# Patient Record
Sex: Female | Born: 1995 | Race: Black or African American | Hispanic: No | Marital: Single | Smoking: Never smoker
Health system: Southern US, Community
[De-identification: ages and names within clinical notes are randomized; demographics above are authoritative.]

## PROBLEM LIST (undated history)

## (undated) HISTORY — PX: TONSILLECTOMY AND ADENOIDECTOMY: SHX28

---

## 2018-07-15 ENCOUNTER — Other Ambulatory Visit: Payer: Self-pay

## 2018-07-15 ENCOUNTER — Emergency Department (HOSPITAL_COMMUNITY)
Admission: EM | Admit: 2018-07-15 | Discharge: 2018-07-15 | Disposition: A | Discharge status: Home / Self Care | Payer: BLUE CROSS/BLUE SHIELD | Attending: Emergency Medicine | Admitting: Emergency Medicine

## 2018-07-15 ENCOUNTER — Encounter (HOSPITAL_COMMUNITY): Payer: Self-pay | Admitting: Emergency Medicine

## 2018-07-15 DIAGNOSIS — J069 Acute upper respiratory infection, unspecified: Secondary | ICD-10-CM | POA: Diagnosis not present

## 2018-07-15 DIAGNOSIS — B9789 Other viral agents as the cause of diseases classified elsewhere: Secondary | ICD-10-CM | POA: Diagnosis not present

## 2018-07-15 DIAGNOSIS — R05 Cough: Secondary | ICD-10-CM | POA: Diagnosis present

## 2018-07-15 NOTE — ED Provider Notes (Signed)
[facility]-EMERGENCY DEPT Provider Note   CSN: [id number] Arrival date & time: 07/15/18  2041     History   Chief Complaint Chief Complaint  Patient presents with  . Cough    HPI Kayla Duarte is a 22 y.o. female.  Patient to ED with symptoms of nasal congestion, sometimes productive cough, fever and generalized muscle aches x 4 days. She reports coworkers with similar symptoms. No nausea or vomiting. She has been taking mucinex at home which has made her cough productive but she does not feel any better. No rash, urinary symptoms, or diarrhea.  The history is provided by the patient. No language interpreter was used.  Cough  Associated symptoms include chills and myalgias.    History reviewed. No pertinent past medical history.  There are no active problems to display for this patient.   History reviewed. No pertinent surgical history.   OB History   No obstetric history on file.      Home Medications    Prior to Admission medications   Not on File    Family History No family history on file.  Social History Social History   Tobacco Use  . Smoking status: Not on file  Substance Use Topics  . Alcohol use: Not on file  . Drug use: Not on file     Allergies   Patient has no known allergies.   Review of Systems Review of Systems  Constitutional: Positive for chills and fever.  HENT: Positive for congestion.   Respiratory: Positive for cough.   Cardiovascular: Negative.   Gastrointestinal: Negative.  Negative for diarrhea and vomiting.  Musculoskeletal: Positive for myalgias. Negative for neck stiffness.  Skin: Negative.  Negative for rash.  Neurological: Negative.      Physical Exam Updated Vital Signs BP (!) 106/59   Pulse 80   Temp 99.9 F (37.7 C) (Oral)   Resp 14   Ht 5\' 8"  (1.727 m)   Wt 72.6 kg   LMP 07/15/2018   SpO2 100%   BMI 24.33 kg/m   Physical Exam Constitutional:      Appearance: She is  well-developed.  HENT:     Head: Normocephalic.  Neck:     Musculoskeletal: Normal range of motion and neck supple.  Cardiovascular:     Rate and Rhythm: Normal rate and regular rhythm.     Heart sounds: No murmur.  Pulmonary:     Effort: Pulmonary effort is normal.     Breath sounds: Normal breath sounds. No wheezing, rhonchi or rales.  Abdominal:     General: Bowel sounds are normal.     Palpations: Abdomen is soft.     Tenderness: There is no abdominal tenderness. There is no guarding or rebound.  Musculoskeletal: Normal range of motion.  Lymphadenopathy:     Cervical: No cervical adenopathy.  Skin:    General: Skin is warm and dry.     Findings: No rash.  Neurological:     Mental Status: She is alert and oriented to person, place, and time.      ED Treatments / Results  Labs (all labs ordered are listed, but only abnormal results are displayed) Labs Reviewed - No data to display  EKG None  Radiology No results found.  Procedures Procedures (including critical care time)  Medications Ordered in ED Medications - No data to display   Initial Impression / Assessment and Plan / ED Course  I have reviewed the triage vital signs and the  nursing notes.  Pertinent labs & imaging results that were available during my care of the patient were reviewed by me and considered in my medical decision making (see chart for details).     Patient to ED with URI symptoms including fever, cough, congestion and body aches.   She is very well appearing. Exam is benign. Suspect viral illness requiring supportive care.   Final Clinical Impressions(s) / ED Diagnoses   Final diagnoses:  None   1. URI   ED Discharge Orders    None       Elpidio Anis, PA-C 07/16/18 0040    Dione Booze, MD 07/16/18 [phone]

## 2018-07-15 NOTE — ED Triage Notes (Signed)
Patient c/o cough, congestion, body aches and fever x4 days.

## 2018-07-15 NOTE — Discharge Instructions (Addendum)
Recommend Advil Cold and Sinus; plain saline nasal spray; Mucinex; fluids and plenty of rest. Return here or be seen by your doctor if symptoms persist or worsen.

## 2019-07-31 DIAGNOSIS — G8929 Other chronic pain: Secondary | ICD-10-CM

## 2019-07-31 HISTORY — DX: Other chronic pain: G89.29

## 2019-08-14 ENCOUNTER — Encounter (HOSPITAL_COMMUNITY): Payer: Self-pay | Admitting: Emergency Medicine

## 2019-08-14 ENCOUNTER — Other Ambulatory Visit: Payer: Self-pay

## 2019-08-14 ENCOUNTER — Emergency Department (HOSPITAL_COMMUNITY)
Admission: EM | Admit: 2019-08-14 | Discharge: 2019-08-14 | Disposition: A | Discharge status: Home / Self Care | Payer: BC Managed Care – PPO | Attending: Emergency Medicine | Admitting: Emergency Medicine

## 2019-08-14 DIAGNOSIS — Z20822 Contact with and (suspected) exposure to covid-19: Secondary | ICD-10-CM | POA: Diagnosis not present

## 2019-08-14 DIAGNOSIS — Y93I9 Activity, other involving external motion: Secondary | ICD-10-CM | POA: Insufficient documentation

## 2019-08-14 DIAGNOSIS — M545 Low back pain: Secondary | ICD-10-CM | POA: Diagnosis not present

## 2019-08-14 DIAGNOSIS — Y999 Unspecified external cause status: Secondary | ICD-10-CM | POA: Diagnosis not present

## 2019-08-14 DIAGNOSIS — M542 Cervicalgia: Secondary | ICD-10-CM | POA: Insufficient documentation

## 2019-08-14 DIAGNOSIS — Y92411 Interstate highway as the place of occurrence of the external cause: Secondary | ICD-10-CM | POA: Insufficient documentation

## 2019-08-14 LAB — SARS CORONAVIRUS 2 (TAT 6-24 HRS): SARS Coronavirus 2: NEGATIVE

## 2019-08-14 MED ORDER — CYCLOBENZAPRINE HCL 10 MG PO TABS: 10.0000 mg | ORAL_TABLET | Freq: Two times a day (BID) | ORAL | 0 refills | Status: DC | PRN

## 2019-08-14 MED ORDER — IBUPROFEN 600 MG PO TABS: 600.0000 mg | ORAL_TABLET | Freq: Four times a day (QID) | ORAL | 0 refills | Status: DC | PRN
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
## 2019-08-18 ENCOUNTER — Encounter: Payer: Self-pay | Admitting: Orthopaedic Surgery

## 2019-08-18 ENCOUNTER — Ambulatory Visit (INDEPENDENT_AMBULATORY_CARE_PROVIDER_SITE_OTHER): Payer: BC Managed Care – PPO | Admitting: Orthopaedic Surgery

## 2019-08-18 ENCOUNTER — Ambulatory Visit: Payer: Self-pay

## 2019-08-18 ENCOUNTER — Other Ambulatory Visit: Payer: Self-pay

## 2019-08-18 DIAGNOSIS — S161XXA Strain of muscle, fascia and tendon at neck level, initial encounter: Secondary | ICD-10-CM

## 2019-08-18 MED ORDER — PREDNISONE 10 MG (21) PO TBPK: ORAL_TABLET | ORAL | 0 refills | Status: DC

## 2019-08-18 MED ORDER — METHOCARBAMOL 500 MG PO TABS: 500.0000 mg | ORAL_TABLET | Freq: Two times a day (BID) | ORAL | 0 refills | Status: DC | PRN
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
## 2019-08-26 ENCOUNTER — Ambulatory Visit (INDEPENDENT_AMBULATORY_CARE_PROVIDER_SITE_OTHER): Payer: BC Managed Care – PPO | Admitting: Orthopaedic Surgery

## 2019-08-26 ENCOUNTER — Other Ambulatory Visit: Payer: Self-pay

## 2019-08-26 ENCOUNTER — Encounter: Payer: Self-pay | Admitting: Orthopaedic Surgery

## 2019-08-26 DIAGNOSIS — S161XXD Strain of muscle, fascia and tendon at neck level, subsequent encounter: Secondary | ICD-10-CM
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
## 2020-05-30 DIAGNOSIS — H469 Unspecified optic neuritis: Secondary | ICD-10-CM

## 2020-05-30 HISTORY — DX: Unspecified optic neuritis: H46.9

## 2020-06-16 ENCOUNTER — Emergency Department (HOSPITAL_COMMUNITY)
Admission: EM | Admit: 2020-06-16 | Discharge: 2020-06-16 | Discharge status: Against Medical Advice | Payer: BC Managed Care – PPO | Source: Home / Self Care | Attending: Emergency Medicine | Admitting: Emergency Medicine

## 2020-06-16 ENCOUNTER — Encounter (HOSPITAL_COMMUNITY): Payer: Self-pay

## 2020-06-16 ENCOUNTER — Other Ambulatory Visit: Payer: Self-pay

## 2020-06-16 ENCOUNTER — Emergency Department (HOSPITAL_COMMUNITY): Payer: BC Managed Care – PPO

## 2020-06-16 DIAGNOSIS — H5711 Ocular pain, right eye: Secondary | ICD-10-CM | POA: Insufficient documentation

## 2020-06-16 DIAGNOSIS — H539 Unspecified visual disturbance: Secondary | ICD-10-CM

## 2020-06-16 DIAGNOSIS — H469 Unspecified optic neuritis: Secondary | ICD-10-CM | POA: Diagnosis not present

## 2020-06-16 DIAGNOSIS — H538 Other visual disturbances: Secondary | ICD-10-CM | POA: Insufficient documentation

## 2020-06-16 NOTE — Progress Notes (Signed)
Spoke to charge RN MRI scanner is down will call back once its back up.

## 2020-06-16 NOTE — ED Notes (Signed)
Pt and pt mother denied hospital transport to [facility] for MRI. Pt and pt mother, when leaving the ED AMA stated they were going to [facility] so they could get their MRI tonight instead of coming back tomorrow out patient. Army Melia, PA aware.

## 2020-06-16 NOTE — Discharge Instructions (Addendum)
As discussed emergent MRI of the brain and orbits ordered by your eye doctor, unable to complete at [facility] at this time. Recommend [facility] for MRI tonight but you have declined transfer. Order for MRI was sent to [facility]. Please return to the ER for your imaging. Otherwise, order sent for imaging to be done tomorrow.

## 2020-06-16 NOTE — ED Provider Notes (Signed)
[facility]-EMERGENC[facility] DEPT Provider Note   CSN: [id number] Arrival date & time: 06/16/20  1521     History Chief Complaint  Patient presents with  . Blurred Vision    Kayla Duarte is a 24 y.o. female.  24 year old female sent to the ER by Dr. Sherryll Burger with ophthalmology for MRI brain with and without contrast as well as MRI orbit with and without contrast for concern for optic neuritis, possible MS. Patient wears glasses, first noticed pain in her right eye described as eye strain 2 days ago as well as central area of blurry vision with normal peripheral vision. Also reports tenderness with palpation around her right eye. Patient removed her contacts and has been wearing her glasses. Denies trauma to the eye, photophobia, family history of MS or other concerns. Patient was given phenelepherine drops in the office and reports her vision and pain have since improved.         History reviewed. No pertinent past medical history.  There are no problems to display for this patient.   History reviewed. No pertinent surgical history.   OB History   No obstetric history on file.     History reviewed. No pertinent family history.  Social History   Tobacco Use  . Smoking status: Never Smoker  . Smokeless tobacco: Never Used  Substance Use Topics  . Alcohol use: Not on file  . Drug use: Not on file    Home Medications Prior to Admission medications   Medication Sig Start Date End Date Taking? Authorizing Provider  cyclobenzaprine (FLEXERIL) 10 MG tablet Take 1 tablet (10 mg total) by mouth 2 (two) times daily as needed for muscle spasms. Patient not taking: Reported on 08/26/2019 08/14/19   Fayrene Helper, PA-C  ibuprofen (ADVIL) 600 MG tablet Take 1 tablet (600 mg total) by mouth every 6 (six) hours as needed. 08/14/19   Fayrene Helper, PA-C  methocarbamol (ROBAXIN) 500 MG tablet Take 1 tablet (500 mg total) by mouth 2 (two) times daily as needed. Patient not  taking: Reported on 08/26/2019 08/18/19   Cristie Hem, PA-C  predniSONE (STERAPRED UNI-PAK 21 TAB) 10 MG (21) TBPK tablet Take as directed Patient not taking: Reported on 08/26/2019 08/18/19   Cristie Hem, PA-C    Allergies    Patient has no known allergies.  Review of Systems   Review of Systems  Constitutional: Negative for chills and fever.  HENT: Negative for congestion and sinus pain.   Eyes: Positive for pain and visual disturbance. Negative for photophobia and redness.  Musculoskeletal: Negative for neck pain and neck stiffness.  Skin: Negative for color change, rash and wound.  Allergic/Immunologic: Negative for immunocompromised state.  Neurological: Positive for headaches. Negative for speech difficulty, weakness and numbness.  Hematological: Negative for adenopathy.  All other systems reviewed and are negative.   Physical Exam Updated Vital Signs BP 130/69   Pulse 79   Temp 98.1 F (36.7 C) (Oral)   Resp 18   SpO2 99%   Physical Exam Vitals and nursing note reviewed.  Constitutional:      General: She is not in acute distress.    Appearance: She is well-developed. She is not diaphoretic.  HENT:     Head: Normocephalic and atraumatic.     Nose: Nose normal.     Mouth/Throat:     Mouth: Mucous membranes are moist.  Eyes:     General: No visual field deficit or scleral icterus.  Right eye: No discharge.        Left eye: No discharge.     Extraocular Movements: Extraocular movements intact.     Conjunctiva/sclera: Conjunctivae normal.     Comments: Pupils dilated at ophthalmology office, reactive   Pulmonary:     Effort: Pulmonary effort is normal.  Lymphadenopathy:     Cervical: No cervical adenopathy.  Skin:    General: Skin is warm.     Findings: No erythema or rash.  Neurological:     Mental Status: She is alert and oriented to person, place, and time.     Cranial Nerves: No cranial nerve deficit or facial asymmetry.     Sensory: No  sensory deficit.     Motor: No weakness.     Gait: Gait normal.  Psychiatric:        Behavior: Behavior normal.     ED Results / Procedures / Treatments   Labs (all labs ordered are listed, but only abnormal results are displayed) Labs Reviewed - No data to display  EKG None  Radiology No results found.  Procedures Procedures (including critical care time)  Medications Ordered in ED Medications - No data to display  ED Course  I have reviewed the triage vital signs and the nursing notes.  Pertinent labs & imaging results that were available during my care of the patient were reviewed by me and considered in my medical decision making (see chart for details).  Clinical Course as of Jun 17 1919  Thu Jun 16, 2020  [id number] 24 year old female sent by ophthalmology for MRI brain and orbit.  Review of records from ophthalmology, concern for trace disc edema and hyperemia at the right eye.  Patient reports symptoms have improved since leaving ophthalmology. MRI was ordered for possible optic neuritis with concern for associated MS. Unfortunately, MRI at this emergency room is not available at this time and is currently being worked with unknown return to service time. Offered to transfer patient to [facility] for MRI tonight, patient and her mother ultimately declined with plan to have her imaging done in the morning. Discussed reasons for emergent imaging and delaying until tomorrow could result in poor outcome. Order sent for OP imaging to [facility].  After leaving AMA, patient and mother commented to nurse on the way out that they would be going to [facility] after all for imaging tonight. Informed Dr. Stevie Kern and green zone team of patient's possible pending arrival to [facility] tonight.    [LM]    Clinical Course User Index [LM] Alden Hipp   MDM Rules/Calculators/A&P                          Final Clinical Impression(s) / ED Diagnoses Final diagnoses:  Visual disturbance  Pain of  right eye    Rx / DC Orders ED Discharge Orders         Ordered    MR BRAIN W WO CONTRAST        06/16/20 1849    MR ORBITS W WO CONTRAST        06/16/20 1849           Jeannie Fend, PA-C 06/16/20 1920    Pollyann Savoy, MD 06/16/20 2023

## 2020-06-16 NOTE — ED Triage Notes (Signed)
Pt coming from eye doctor appointment for blurry vision. Per pt- the eye doctor stated that she has optic neuritis and needs to come to ED for further evaluation and possibly a MRI. Pt brought paperwork from eye appointment.

## 2020-06-17 ENCOUNTER — Encounter (HOSPITAL_COMMUNITY): Payer: Self-pay

## 2020-06-17 ENCOUNTER — Emergency Department (HOSPITAL_COMMUNITY): Payer: BC Managed Care – PPO

## 2020-06-17 ENCOUNTER — Other Ambulatory Visit: Payer: Self-pay

## 2020-06-17 ENCOUNTER — Inpatient Hospital Stay (HOSPITAL_COMMUNITY)
Admission: EM | Admit: 2020-06-17 | Discharge: 2020-06-21 | DRG: 123 | Disposition: A | Discharge status: Home / Self Care | Payer: BC Managed Care – PPO | Source: Ambulatory Visit | Attending: Student | Admitting: Student

## 2020-06-17 DIAGNOSIS — H469 Unspecified optic neuritis: Principal | ICD-10-CM

## 2020-06-17 DIAGNOSIS — Z6832 Body mass index (BMI) 32.0-32.9, adult: Secondary | ICD-10-CM | POA: Diagnosis not present

## 2020-06-17 DIAGNOSIS — Z833 Family history of diabetes mellitus: Secondary | ICD-10-CM

## 2020-06-17 DIAGNOSIS — Z20822 Contact with and (suspected) exposure to covid-19: Secondary | ICD-10-CM | POA: Diagnosis present

## 2020-06-17 DIAGNOSIS — E669 Obesity, unspecified: Secondary | ICD-10-CM | POA: Diagnosis present

## 2020-06-17 DIAGNOSIS — Z8249 Family history of ischemic heart disease and other diseases of the circulatory system: Secondary | ICD-10-CM | POA: Diagnosis not present

## 2020-06-17 LAB — BASIC METABOLIC PANEL
Anion gap: 7 (ref 5–15)
BUN: 7 mg/dL (ref 6–20)
CO2: 25 mmol/L (ref 22–32)
Calcium: 9.1 mg/dL (ref 8.9–10.3)
Chloride: 106 mmol/L (ref 98–111)
Creatinine, Ser: 0.59 mg/dL (ref 0.44–1.00)
GFR, Estimated: >60 mL/min (ref >60)
Glucose, Bld: 87 mg/dL (ref 70–99)
Potassium: 4.4 mmol/L (ref 3.5–5.1)
Sodium: 138 mmol/L (ref 135–145)

## 2020-06-17 LAB — CBC WITH DIFFERENTIAL/PLATELET
Abs Immature Granulocytes: 0 10*3/uL (ref 0.00–0.07)
Basophils Absolute: 0.1 10*3/uL (ref 0.0–0.1)
Basophils Relative: 1 %
Eosinophils Absolute: 0.1 10*3/uL (ref 0.0–0.5)
Eosinophils Relative: 2 %
HCT: 42.7 % (ref 36.0–46.0)
Hemoglobin: 13.9 g/dL (ref 12.0–15.0)
Immature Granulocytes: 0 %
Lymphocytes Relative: 39 %
Lymphs Abs: 1.7 10*3/uL (ref 0.7–4.0)
MCH: 29.3 pg (ref 26.0–34.0)
MCHC: 32.6 g/dL (ref 30.0–36.0)
MCV: 89.9 fL (ref 80.0–100.0)
Monocytes Absolute: 0.3 10*3/uL (ref 0.1–1.0)
Monocytes Relative: 7 %
Neutro Abs: 2.2 10*3/uL (ref 1.7–7.7)
Neutrophils Relative %: 51 %
Platelets: 355 10*3/uL (ref 150–400)
RBC: 4.75 MIL/uL (ref 3.87–5.11)
RDW: 13.9 % (ref 11.5–15.5)
WBC: 4.3 10*3/uL (ref 4.0–10.5)
nRBC: 0 % (ref 0.0–0.2)

## 2020-06-17 LAB — HIV ANTIBODY (ROUTINE TESTING W REFLEX): HIV Screen 4th Generation wRfx: NONREACTIVE

## 2020-06-17 LAB — I-STAT BETA HCG BLOOD, ED (MC, WL, AP ONLY): I-stat hCG, quantitative: <5 m[IU]/mL (ref <5)

## 2020-06-17 LAB — RESP PANEL BY RT-PCR (FLU A&B, COVID) ARPGX2
Influenza A by PCR: NEGATIVE
Influenza B by PCR: NEGATIVE
SARS Coronavirus 2 by RT PCR: NEGATIVE

## 2020-06-17 IMAGING — MR MR ORBITS WO/W CM
20 series · 48 of 48 positions shown · IV contrast (gadavist)
Comparison: No pertinent prior exams are available for comparison.

CLINICAL DATA: Multiple sclerosis, new event. Optic neuritis
suspected. Additional history provided: Diagnosed with optic
neuritis by eye doctor yesterday, concern for MS, patient reports
right eye pain.

EXAM:
MRI HEAD AND ORBITS WITHOUT AND WITH CONTRAST
TECHNIQUE: Multiplanar, multiecho pulse sequences of the brain and surrounding
structures were obtained without and with intravenous contrast.
Multiplanar, multiecho pulse sequences of the orbits and surrounding
structures were obtained including fat saturation techniques, before
and after intravenous contrast administration.
CONTRAST:  7mL GADAVIST GADOBUTROL 1 MMOL/ML IV SOLN

[Series 5: DWI · axial · 3.0mm · 1.31mm/px · z∈[-68,+85]mm · 6 of 108 slices shown (1 of 4)]
[im 1/108]
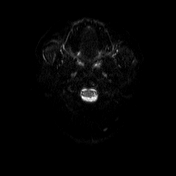
[im 22/108]
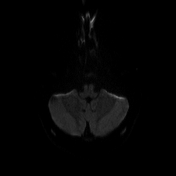
[im 43/108]
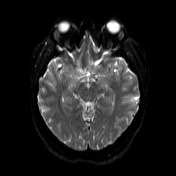
[im 65/108]
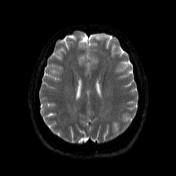
[im 86/108]
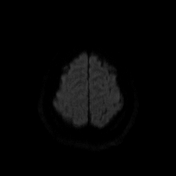
[im 108/108]
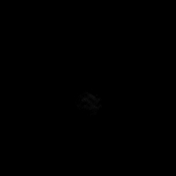

[Series 6: DWI · axial · 3.0mm · 1.31mm/px · z∈[-68,+85]mm · 3 of 54 slices shown (2 of 4)]
[im 1/54]
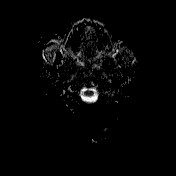
[im 27/54]
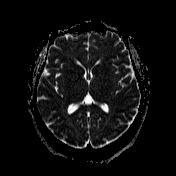
[im 54/54]
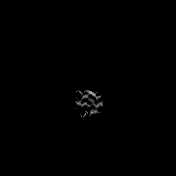

[Series 7: mip_images(sw) · axial · 24.0mm · 0.69mm/px · z∈[-63,+76]mm · 2 of 49 slices shown]
[im 1/49]
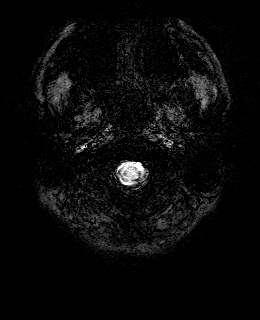
[im 49/49]
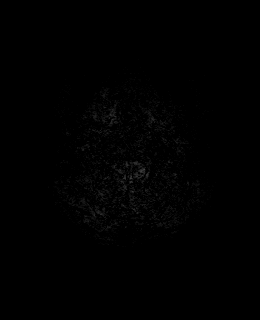

[Series 8: swi_images · axial · 3.0mm · 0.69mm/px · z∈[-73,+86]mm · 3 of 56 slices shown]
[im 1/56]
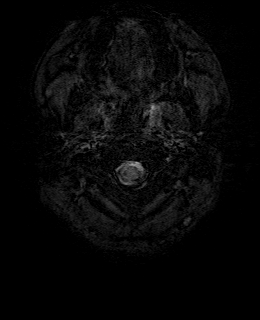
[im 28/56]
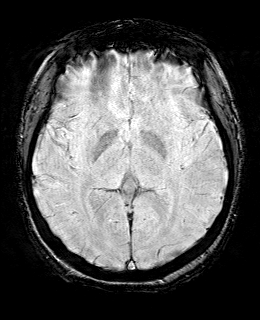
[im 56/56]
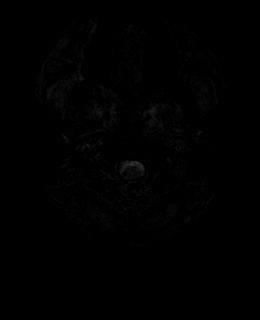

[Series 9: FLAIR · axial · 3.0mm · 0.69mm/px · z∈[-72,+81]mm · 2 of 54 slices shown]
[im 1/54]
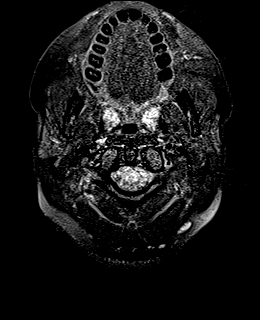
[im 54/54]
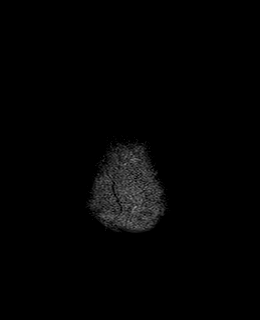

[Series 10: T1 · sagittal · 5.0mm · 0.75mm/px · 1 of 25 slices shown (1 of 4)]
[im 1/25]
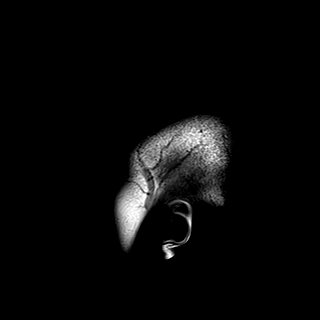

[Series 11: T2 · axial · 5.0mm · 0.57mm/px · 1 of 26 slices shown (1 of 2)]
[im 1/26]
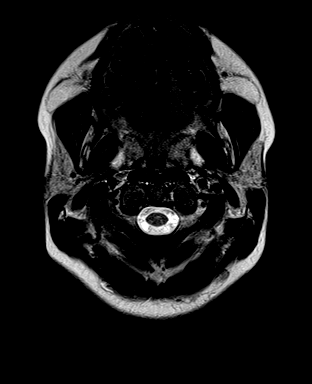

[Series 12: T2 · coronal · 5.0mm · 0.57mm/px · 1 of 27 slices shown (2 of 2)]
[im 1/27]
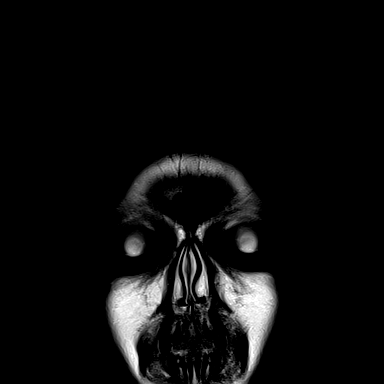

[Series 13: T1 · axial · 1.0mm · 0.86mm/px · z∈[-83,+86]mm · 8 of 176 slices shown (2 of 4)]
[im 1/176]
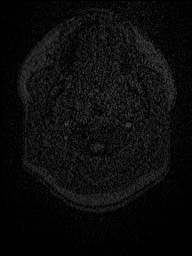
[im 26/176]
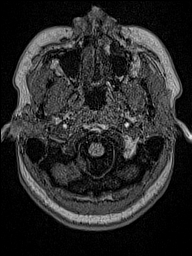
[im 51/176]
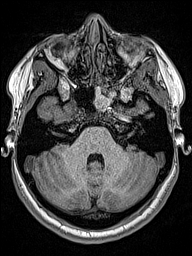
[im 76/176]
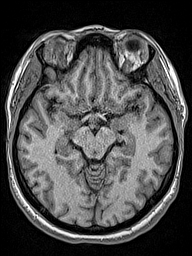
[im 101/176]
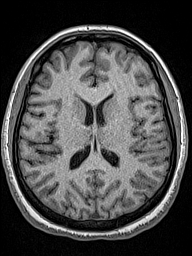
[im 126/176]
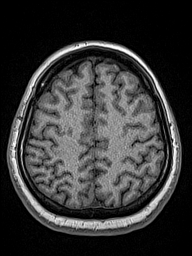
[im 151/176]
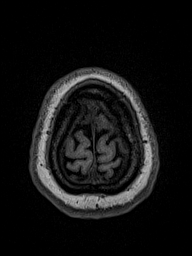
[im 176/176]
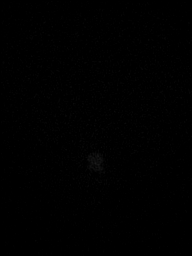

[Series 14: DWI · coronal · 5.0mm · 1.31mm/px · 3 of 72 slices shown (3 of 4)]
[im 1/72]
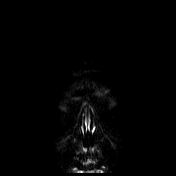
[im 36/72]
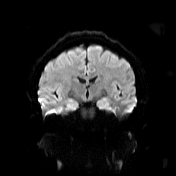
[im 72/72]
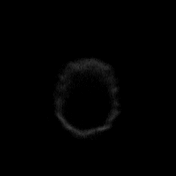

[Series 15: DWI · coronal · 5.0mm · 1.31mm/px · 2 of 36 slices shown (4 of 4)]
[im 1/36]
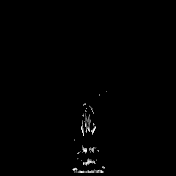
[im 36/36]
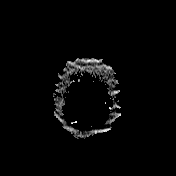

[Series 16: T2 fat-sat · axial · 3.0mm · 0.47mm/px · 1 of 17 slices shown (1 of 2)]
[im 1/17]
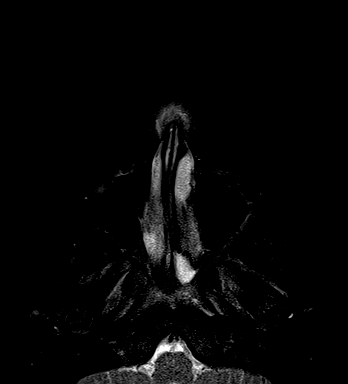

[Series 17: T1 · axial · 3.0mm · 0.56mm/px · 1 of 17 slices shown (3 of 4)]
[im 1/17]
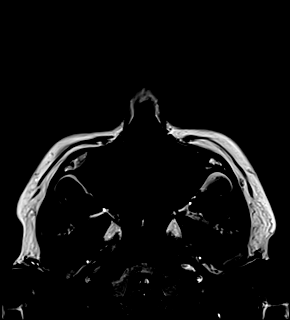

[Series 18: T2 fat-sat · coronal · 3.0mm · 0.47mm/px · 1 of 29 slices shown (2 of 2)]
[im 1/29]
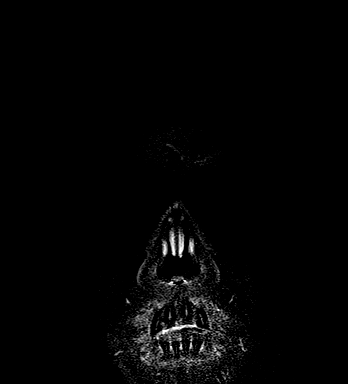

[Series 19: T1 · coronal · 3.0mm · 0.56mm/px · 1 of 29 slices shown (4 of 4)]
[im 1/29]
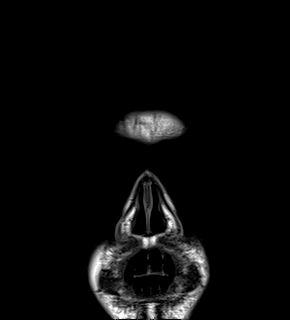

[Series 20: T1 fat-sat post-contrast · axial · 3.0mm · 0.56mm/px · 1 of 17 slices shown (1 of 2)]
[im 1/17]
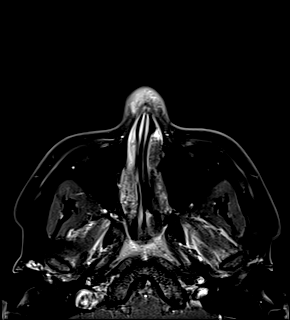

[Series 21: T1 fat-sat post-contrast · coronal · 3.0mm · 0.70mm/px · 1 of 29 slices shown (2 of 2)]
[im 1/29]
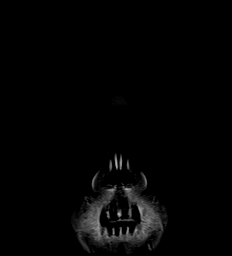

[Series 22: T1 post-contrast · axial · 1.0mm · 0.86mm/px · z∈[-83,+86]mm · 8 of 176 slices shown (1 of 3)]
[im 1/176]
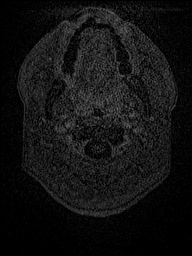
[im 26/176]
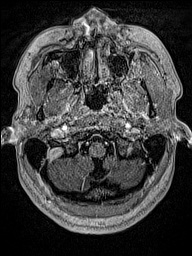
[im 51/176]
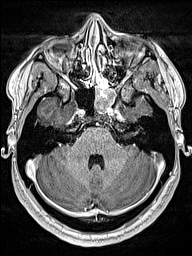
[im 76/176]
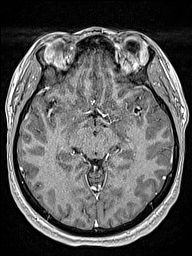
[im 101/176]
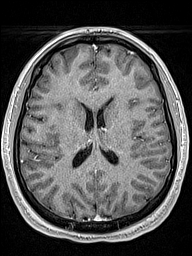
[im 126/176]
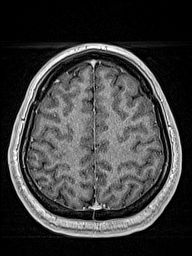
[im 151/176]
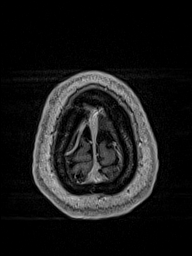
[im 176/176]
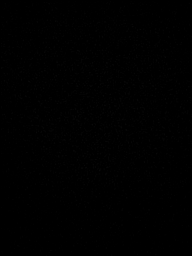

[Series 23: T1 post-contrast · coronal · 5.0mm · 0.43mm/px · 1 of 27 slices shown (2 of 3)]
[im 1/27]
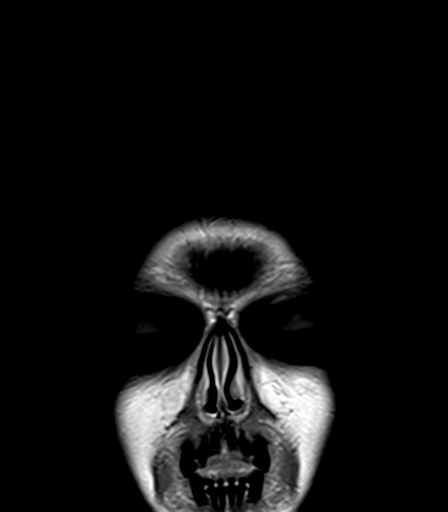

[Series 24: T1 post-contrast · sagittal · 5.0mm · 0.75mm/px · 1 of 25 slices shown (3 of 3)]
[im 1/25]
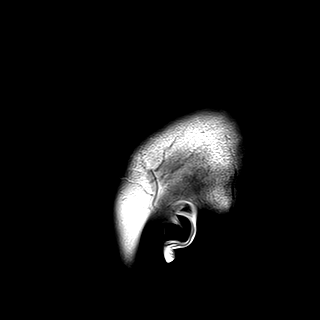

[48 of 48 positions shown; findings below may reference images not displayed]

FINDINGS: MRI HEAD FINDINGS

Brain:

Mild intermittent motion degradation.

Cerebral volume is normal.

No focal parenchymal signal abnormality or abnormal intracranial
enhancement is identified.

There is no acute infarct.

No evidence of intracranial mass.

No chronic intracranial blood products.

No extra-axial fluid collection.

No midline shift.

Vascular: Normal proximal arterial flow voids. Expected vascular
enhancement.

Skull and upper cervical spine: No focal marrow lesion.

MRI ORBITS FINDINGS

Orbits: There is swelling, T2 hyperintensity and abnormal
enhancement of the intraorbital right optic nerve. The globes are
normal in size and contour. The extraocular muscles are symmetric
and unremarkable. Normal appearance of the left optic nerve sheath
complex.

Visualized sinuses: Inspissated secretions throughout the left
sphenoid sinus. Mild bilateral ethmoid sinus mucosal thickening. 13
mm focus of polypoid soft tissue within the left nasal passage
(series 16, image 5).

Soft tissues: The visualized maxillofacial soft tissues are
unremarkable.
IMPRESSION: MRI brain:

Unremarkable MRI appearance of the brain. No evidence of acute
intracranial abnormality.

MRI orbits:

1. Swelling, T2 hyperintensity and abnormal enhancement of the
intraorbital right optic nerve compatible with acute right optic
neuritis.
2. Ethmoid and left sphenoid sinusitis.
3. Polypoid soft tissue measuring 13 mm within the left nasal
passage. Direct visualization is recommended.

## 2020-06-17 IMAGING — MR MR HEAD WO/W CM
20 series · 48 of 48 positions shown · IV contrast (gadavist)
Comparison: No pertinent prior exams are available for comparison.

CLINICAL DATA: Multiple sclerosis, new event. Optic neuritis
suspected. Additional history provided: Diagnosed with optic
neuritis by eye doctor yesterday, concern for MS, patient reports
right eye pain.

EXAM:
MRI HEAD AND ORBITS WITHOUT AND WITH CONTRAST
TECHNIQUE: Multiplanar, multiecho pulse sequences of the brain and surrounding
structures were obtained without and with intravenous contrast.
Multiplanar, multiecho pulse sequences of the orbits and surrounding
structures were obtained including fat saturation techniques, before
and after intravenous contrast administration.
CONTRAST:  7mL GADAVIST GADOBUTROL 1 MMOL/ML IV SOLN

[Series 5: DWI · axial · 3.0mm · 1.31mm/px · z∈[-68,+85]mm · 6 of 108 slices shown (1 of 4)]
[im 1/108]
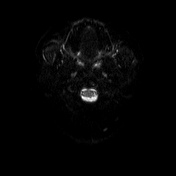
[im 22/108]
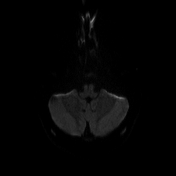
[im 43/108]
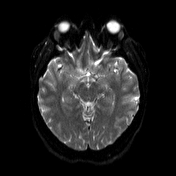
[im 65/108]
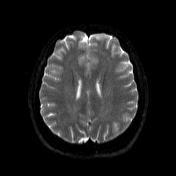
[im 86/108]
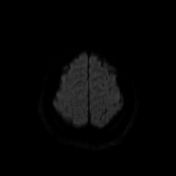
[im 108/108]
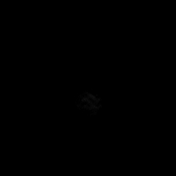

[Series 6: DWI · axial · 3.0mm · 1.31mm/px · z∈[-68,+85]mm · 3 of 54 slices shown (2 of 4)]
[im 1/54]
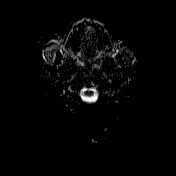
[im 27/54]
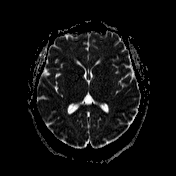
[im 54/54]
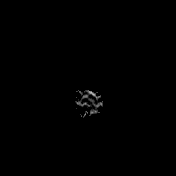

[Series 7: mip_images(sw) · axial · 24.0mm · 0.69mm/px · z∈[-63,+76]mm · 2 of 49 slices shown]
[im 1/49]
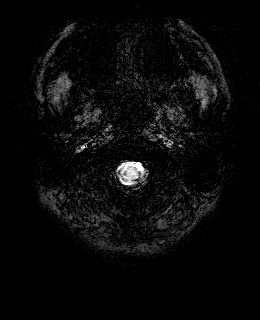
[im 49/49]
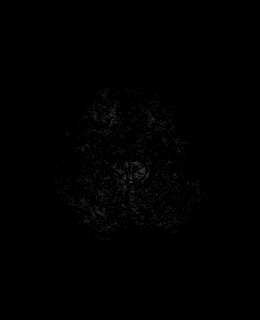

[Series 8: swi_images · axial · 3.0mm · 0.69mm/px · z∈[-73,+86]mm · 3 of 56 slices shown]
[im 1/56]
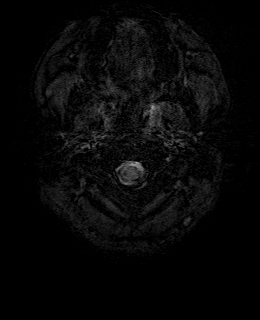
[im 28/56]
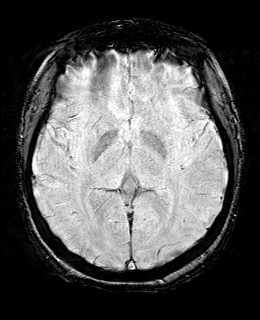
[im 56/56]
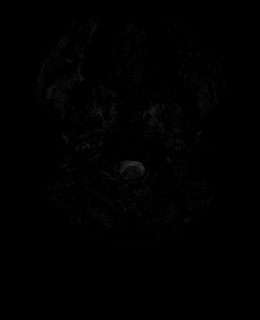

[Series 9: FLAIR · axial · 3.0mm · 0.69mm/px · z∈[-72,+81]mm · 2 of 54 slices shown]
[im 1/54]
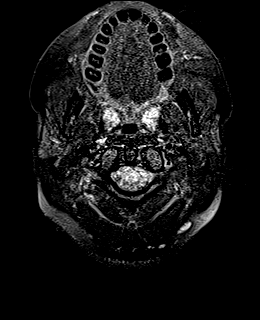
[im 54/54]
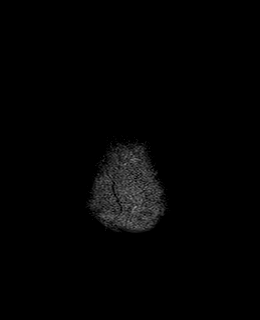

[Series 10: T1 · sagittal · 5.0mm · 0.75mm/px · 1 of 25 slices shown (1 of 4)]
[im 1/25]
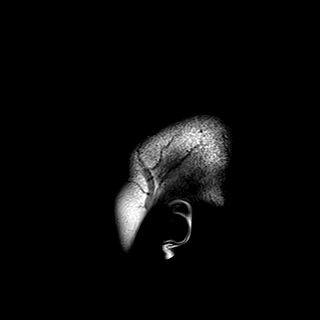

[Series 11: T2 · axial · 5.0mm · 0.57mm/px · 1 of 26 slices shown (1 of 2)]
[im 1/26]
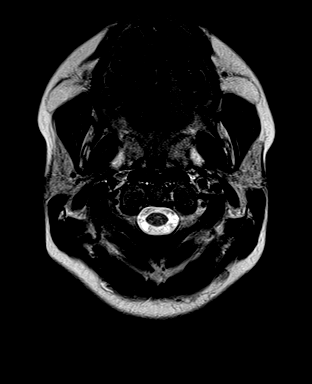

[Series 12: T2 · coronal · 5.0mm · 0.57mm/px · 1 of 27 slices shown (2 of 2)]
[im 1/27]
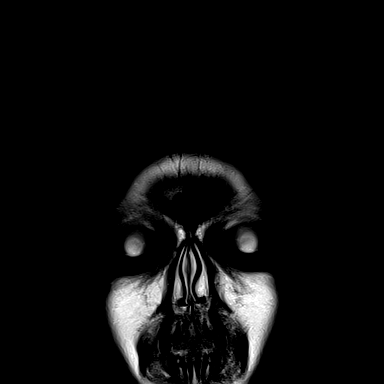

[Series 13: T1 · axial · 1.0mm · 0.86mm/px · z∈[-83,+86]mm · 8 of 176 slices shown (2 of 4)]
[im 1/176]
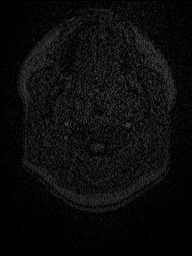
[im 26/176]
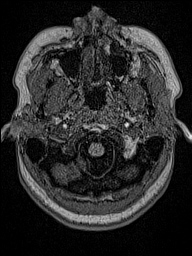
[im 51/176]
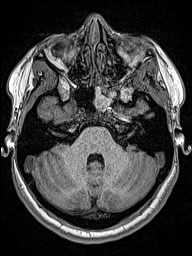
[im 76/176]
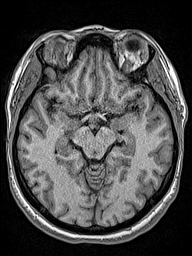
[im 101/176]
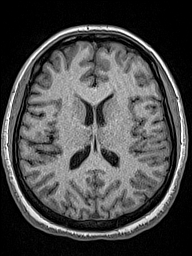
[im 126/176]
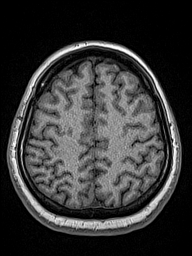
[im 151/176]
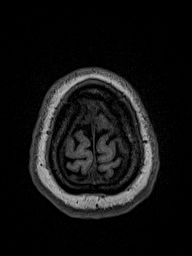
[im 176/176]
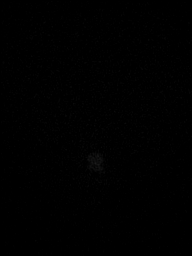

[Series 14: DWI · coronal · 5.0mm · 1.31mm/px · 3 of 72 slices shown (3 of 4)]
[im 1/72]
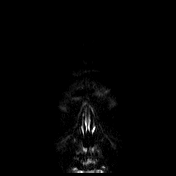
[im 36/72]
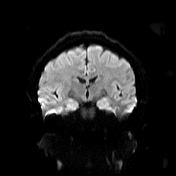
[im 72/72]
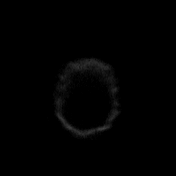

[Series 15: DWI · coronal · 5.0mm · 1.31mm/px · 2 of 36 slices shown (4 of 4)]
[im 1/36]
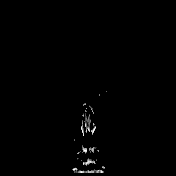
[im 36/36]
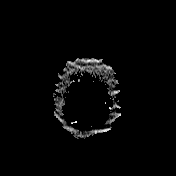

[Series 16: T2 fat-sat · axial · 3.0mm · 0.47mm/px · 1 of 17 slices shown (1 of 2)]
[im 1/17]
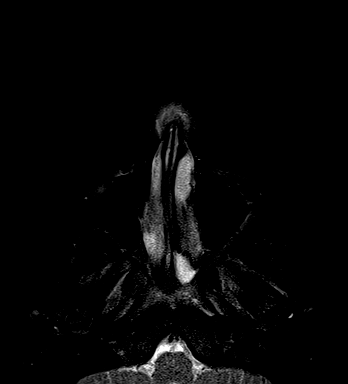

[Series 17: T1 · axial · 3.0mm · 0.56mm/px · 1 of 17 slices shown (3 of 4)]
[im 1/17]
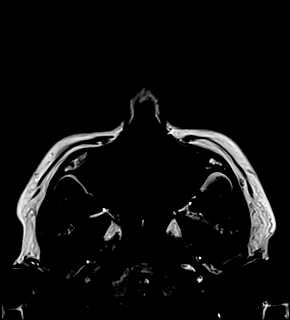

[Series 18: T2 fat-sat · coronal · 3.0mm · 0.47mm/px · 1 of 29 slices shown (2 of 2)]
[im 1/29]
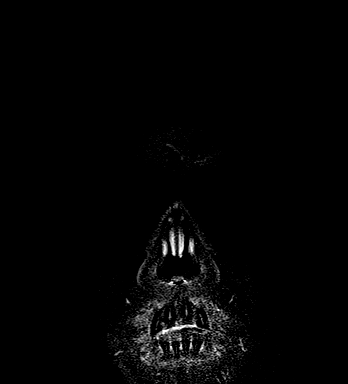

[Series 19: T1 · coronal · 3.0mm · 0.56mm/px · 1 of 29 slices shown (4 of 4)]
[im 1/29]
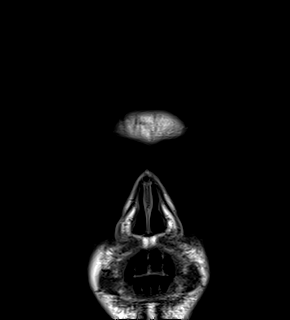

[Series 20: T1 fat-sat post-contrast · axial · 3.0mm · 0.56mm/px · 1 of 17 slices shown (1 of 2)]
[im 1/17]
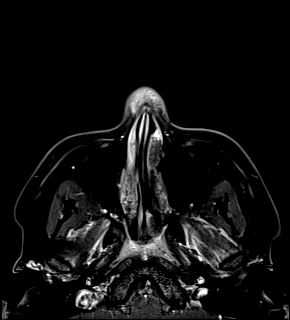

[Series 21: T1 fat-sat post-contrast · coronal · 3.0mm · 0.70mm/px · 1 of 29 slices shown (2 of 2)]
[im 1/29]
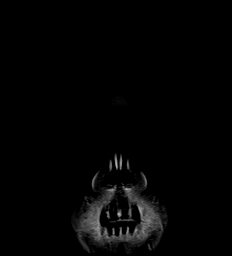

[Series 22: T1 post-contrast · axial · 1.0mm · 0.86mm/px · z∈[-83,+86]mm · 8 of 176 slices shown (1 of 3)]
[im 1/176]
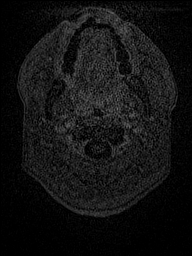
[im 26/176]
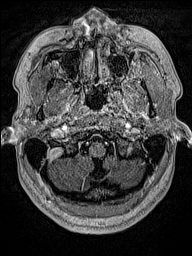
[im 51/176]
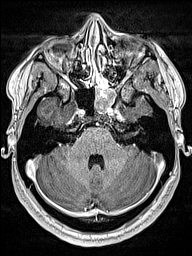
[im 76/176]
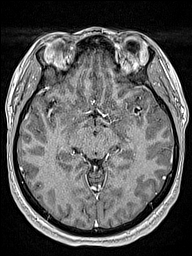
[im 101/176]
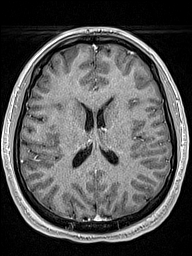
[im 126/176]
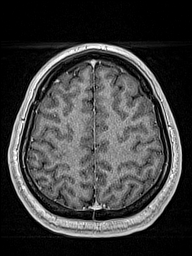
[im 151/176]
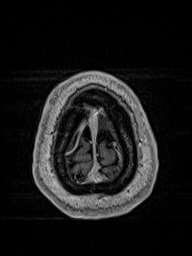
[im 176/176]
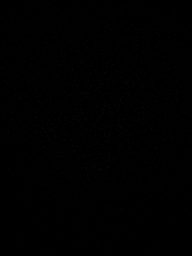

[Series 23: T1 post-contrast · coronal · 5.0mm · 0.43mm/px · 1 of 27 slices shown (2 of 3)]
[im 1/27]
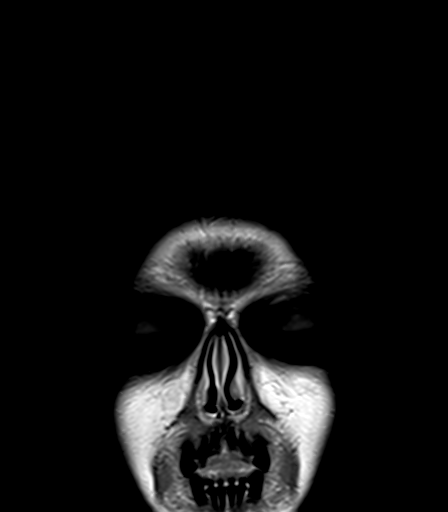

[Series 24: T1 post-contrast · sagittal · 5.0mm · 0.75mm/px · 1 of 25 slices shown (3 of 3)]
[im 1/25]
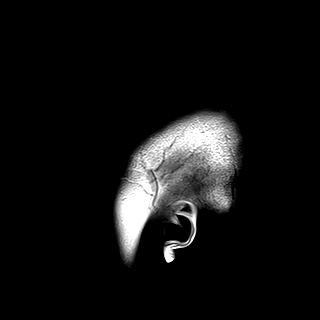

[48 of 48 positions shown; findings below may reference images not displayed]

FINDINGS: MRI HEAD FINDINGS

Brain:

Mild intermittent motion degradation.

Cerebral volume is normal.

No focal parenchymal signal abnormality or abnormal intracranial
enhancement is identified.

There is no acute infarct.

No evidence of intracranial mass.

No chronic intracranial blood products.

No extra-axial fluid collection.

No midline shift.

Vascular: Normal proximal arterial flow voids. Expected vascular
enhancement.

Skull and upper cervical spine: No focal marrow lesion.

MRI ORBITS FINDINGS

Orbits: There is swelling, T2 hyperintensity and abnormal
enhancement of the intraorbital right optic nerve. The globes are
normal in size and contour. The extraocular muscles are symmetric
and unremarkable. Normal appearance of the left optic nerve sheath
complex.

Visualized sinuses: Inspissated secretions throughout the left
sphenoid sinus. Mild bilateral ethmoid sinus mucosal thickening. 13
mm focus of polypoid soft tissue within the left nasal passage
(series 16, image 5).

Soft tissues: The visualized maxillofacial soft tissues are
unremarkable.
IMPRESSION: MRI brain:

Unremarkable MRI appearance of the brain. No evidence of acute
intracranial abnormality.

MRI orbits:

1. Swelling, T2 hyperintensity and abnormal enhancement of the
intraorbital right optic nerve compatible with acute right optic
neuritis.
2. Ethmoid and left sphenoid sinusitis.
3. Polypoid soft tissue measuring 13 mm within the left nasal
passage. Direct visualization is recommended.

## 2020-06-17 MED ORDER — ACETAMINOPHEN 325 MG PO TABS: 650.0000 mg | ORAL_TABLET | Freq: Four times a day (QID) | ORAL | Status: DC | PRN

## 2020-06-17 MED ORDER — LORAZEPAM 2 MG/ML IJ SOLN
1.0000 mg | Freq: Once | INTRAMUSCULAR | Status: DC
  Filled 2020-06-17: qty 1

## 2020-06-17 MED ORDER — SODIUM CHLORIDE 0.9 % IV SOLN
1000.0000 mg | INTRAVENOUS | Status: AC
  Administered 2020-06-18 – 2020-06-21 (×4): 1000 mg via INTRAVENOUS
  Filled 2020-06-17 (×5): qty 8

## 2020-06-17 MED ORDER — ACETAMINOPHEN 650 MG RE SUPP: 650.0000 mg | Freq: Four times a day (QID) | RECTAL | Status: DC | PRN

## 2020-06-17 MED ORDER — GADOBUTROL 1 MMOL/ML IV SOLN
7.0000 mL | Freq: Once | INTRAVENOUS | Status: AC | PRN
  Administered 2020-06-17: 7 mL via INTRAVENOUS

## 2020-06-17 MED ORDER — SODIUM CHLORIDE 0.9 % IV BOLUS
1000.0000 mL | Freq: Once | INTRAVENOUS | Status: AC
  Administered 2020-06-17: 1000 mL via INTRAVENOUS

## 2020-06-17 MED ORDER — ENOXAPARIN SODIUM 40 MG/0.4ML ~~LOC~~ SOLN
40.0000 mg | SUBCUTANEOUS | Status: DC
  Filled 2020-06-17 (×2): qty 0.4

## 2020-06-17 MED ORDER — SODIUM CHLORIDE 0.9 % IV SOLN
1000.0000 mg | Freq: Once | INTRAVENOUS | Status: AC
  Administered 2020-06-17: 1000 mg via INTRAVENOUS
  Filled 2020-06-17: qty 8

## 2020-06-17 NOTE — H&P (Signed)
History and Physical    Kayla Duarte LFY:[id number] DOB: 11/07/1995 DOA: 06/17/2020  PCP: Patient, No Pcp Per  Patient coming from: Home  Chief Complaint: Blurry visiion  HPI: Kayla Duarte is a 24 y.o. female with no significant past medical history who presents with 3 days of blurry vision in R eye. Pt works for [employer] and reported feeling blurry vision with pain in the R eye. Pt initially attributed discomfort to dust at her workplace. Pt replaced her contact lenses with glasses however symptoms persisted. Pt subsequently presented to her ophthalmologist who then referred pt to ED for further work up. Without chest pain or sob   ED Course: In the Ed, pt underwent MRI with findings of optic neuritis involving R eye. Brain MRI was found to be unremarkable. EDP discussed with neurologist who recommended 5 days of IV solumedrol 1gm. Ophthalmologist was called by EDP who had recommended outpt f/u. Hospitalist consulted for consideration for admission  Review of Systems:  Review of Systems  Constitutional: Negative for chills and fever.  HENT: Negative for ear pain and tinnitus.   Eyes: Positive for blurred vision and pain.  Respiratory: Negative for hemoptysis, sputum production and shortness of breath.   Cardiovascular: Negative for palpitations, orthopnea and claudication.  Gastrointestinal: Negative for abdominal pain, nausea and vomiting.  Musculoskeletal: Negative for back pain, joint pain and neck pain.  Neurological: Negative for tremors, seizures, loss of consciousness and weakness.  Psychiatric/Behavioral: Negative for hallucinations, memory loss and substance abuse. The patient is not nervous/anxious.     History reviewed. No pertinent past medical history. Confirmed with patient  History reviewed. No pertinent surgical history.   reports that she has never smoked. She has never used smokeless tobacco. No history on file for alcohol use and drug use.  No Known  Allergies  Family hx: Father with HTN DM runs in family   Prior to Admission medications   Medication Sig Start Date End Date Taking? Authorizing Provider  cyclobenzaprine (FLEXERIL) 10 MG tablet Take 1 tablet (10 mg total) by mouth 2 (two) times daily as needed for muscle spasms. Patient not taking: Reported on 08/26/2019 08/14/19   Fayrene Helper, PA-C  ibuprofen (ADVIL) 600 MG tablet Take 1 tablet (600 mg total) by mouth every 6 (six) hours as needed. 08/14/19   Fayrene Helper, PA-C  methocarbamol (ROBAXIN) 500 MG tablet Take 1 tablet (500 mg total) by mouth 2 (two) times daily as needed. Patient not taking: Reported on 08/26/2019 08/18/19   Cristie Hem, PA-C  predniSONE (STERAPRED UNI-PAK 21 TAB) 10 MG (21) TBPK tablet Take as directed Patient not taking: Reported on 08/26/2019 08/18/19   Cristie Hem, PA-C    Physical Exam: Vitals:   06/17/20 1044 06/17/20 1045 06/17/20 1455  BP: 120/79 120/79 120/90  Pulse: 60 (!) 56 60  Resp: 16  18  Temp: 98.6 F (37 C)  98 F (36.7 C)  TempSrc: Oral  Oral  SpO2: 100% 99% 100%    Constitutional: NAD, calm, comfortable Vitals:   06/17/20 1044 06/17/20 1045 06/17/20 1455  BP: 120/79 120/79 120/90  Pulse: 60 (!) 56 60  Resp: 16  18  Temp: 98.6 F (37 C)  98 F (36.7 C)  TempSrc: Oral  Oral  SpO2: 100% 99% 100%   Eyes: PERRL, lids and conjunctivae normal ENMT: Mucous membranes are moist. Posterior pharynx clear of any exudate or lesions.Normal dentition.  Neck: normal, supple Respiratory: clear to auscultation bilaterally, no wheezing, no crackles. Normal respiratory  effort. No accessory muscle use.  Cardiovascular: Regular rate and rhythm, s1, s2 Abdomen: no tenderness, no masses palpated. No hepatosplenomegaly. Bowel sounds positive.  Musculoskeletal: no clubbing / cyanosis. No joint deformity upper and lower extremities. Good ROM, no contractures. Normal muscle tone.  Skin: no rashes, lesions, ulcers. No induration Neurologic: CN  2-12 grossly intact. Sensation intact, Strength 5/5 in all 4.  Psychiatric: Normal judgment and insight. Alert and oriented x 3. Normal mood.    Labs on Admission: I have personally reviewed following labs and imaging studies  CBC: Recent Labs  Lab 06/17/20 1120  WBC 4.3  NEUTROABS 2.2  HGB 13.9  HCT 42.7  MCV 89.9  PLT 355   Basic Metabolic Panel: Recent Labs  Lab 06/17/20 1120  NA 138  K 4.4  CL 106  CO2 25  GLUCOSE 87  BUN 7  CREATININE 0.59  CALCIUM 9.1   GFR: CrCl cannot be calculated (Unknown ideal weight.). Liver Function Tests: No results for input(s): AST, ALT, ALKPHOS, BILITOT, PROT, ALBUMIN in the last 168 hours. No results for input(s): LIPASE, AMYLASE in the last 168 hours. No results for input(s): AMMONIA in the last 168 hours. Coagulation Profile: No results for input(s): INR, PROTIME in the last 168 hours. Cardiac Enzymes: No results for input(s): CKTOTAL, CKMB, CKMBINDEX, TROPONINI in the last 168 hours. BNP (last 3 results) No results for input(s): PROBNP in the last 8760 hours. HbA1C: No results for input(s): HGBA1C in the last 72 hours. CBG: No results for input(s): GLUCAP in the last 168 hours. Lipid Profile: No results for input(s): CHOL, HDL, LDLCALC, TRIG, CHOLHDL, LDLDIRECT in the last 72 hours. Thyroid Function Tests: No results for input(s): TSH, T4TOTAL, FREET4, T3FREE, THYROIDAB in the last 72 hours. Anemia Panel: No results for input(s): VITAMINB12, FOLATE, FERRITIN, TIBC, IRON, RETICCTPCT in the last 72 hours. Urine analysis: No results found for: COLORURINE, APPEARANCEUR, LABSPEC, PHURINE, GLUCOSEU, HGBUR, BILIRUBINUR, KETONESUR, PROTEINUR, UROBILINOGEN, NITRITE, LEUKOCYTESUR Sepsis Labs: !!!!!!!!!!!!!!!!!!!!!!!!!!!!!!!!!!!!!!!!!!!! @LABRCNTIP (procalcitonin:4,lacticidven:4) )No results found for this or any previous visit (from the past 240 hour(s)).   Radiological Exams on Admission: MR Brain W and Wo Contrast  Result Date:  06/17/2020 CLINICAL DATA:  Multiple sclerosis, new event. Optic neuritis suspected. Additional history provided: Diagnosed with optic neuritis by eye doctor yesterday, concern for MS, patient reports right eye pain. EXAM: MRI HEAD AND ORBITS WITHOUT AND WITH CONTRAST TECHNIQUE: Multiplanar, multiecho pulse sequences of the brain and surrounding structures were obtained without and with intravenous contrast. Multiplanar, multiecho pulse sequences of the orbits and surrounding structures were obtained including fat saturation techniques, before and after intravenous contrast administration. CONTRAST:  91mL GADAVIST GADOBUTROL 1 MMOL/ML IV SOLN COMPARISON:  No pertinent prior exams are available for comparison. FINDINGS: MRI HEAD FINDINGS Brain: Mild intermittent motion degradation. Cerebral volume is normal. No focal parenchymal signal abnormality or abnormal intracranial enhancement is identified. There is no acute infarct. No evidence of intracranial mass. No chronic intracranial blood products. No extra-axial fluid collection. No midline shift. Vascular: Normal proximal arterial flow voids. Expected vascular enhancement. Skull and upper cervical spine: No focal marrow lesion. MRI ORBITS FINDINGS Orbits: There is swelling, T2 hyperintensity and abnormal enhancement of the intraorbital right optic nerve. The globes are normal in size and contour. The extraocular muscles are symmetric and unremarkable. Normal appearance of the left optic nerve sheath complex. Visualized sinuses: Inspissated secretions throughout the left sphenoid sinus. Mild bilateral ethmoid sinus mucosal thickening. 13 mm focus of polypoid soft tissue within the left nasal  passage (series 16, image 5). Soft tissues: The visualized maxillofacial soft tissues are unremarkable. IMPRESSION: MRI brain: Unremarkable MRI appearance of the brain. No evidence of acute intracranial abnormality. MRI orbits: 1. Swelling, T2 hyperintensity and abnormal  enhancement of the intraorbital right optic nerve compatible with acute right optic neuritis. 2. Ethmoid and left sphenoid sinusitis. 3. Polypoid soft tissue measuring 13 mm within the left nasal passage. Direct visualization is recommended. Electronically Signed   By: Jackey Loge DO   On: 06/17/2020 14:54   MR ORBITS W WO CONTRAST  Result Date: 06/17/2020 CLINICAL DATA:  Multiple sclerosis, new event. Optic neuritis suspected. Additional history provided: Diagnosed with optic neuritis by eye doctor yesterday, concern for MS, patient reports right eye pain. EXAM: MRI HEAD AND ORBITS WITHOUT AND WITH CONTRAST TECHNIQUE: Multiplanar, multiecho pulse sequences of the brain and surrounding structures were obtained without and with intravenous contrast. Multiplanar, multiecho pulse sequences of the orbits and surrounding structures were obtained including fat saturation techniques, before and after intravenous contrast administration. CONTRAST:  20mL GADAVIST GADOBUTROL 1 MMOL/ML IV SOLN COMPARISON:  No pertinent prior exams are available for comparison. FINDINGS: MRI HEAD FINDINGS Brain: Mild intermittent motion degradation. Cerebral volume is normal. No focal parenchymal signal abnormality or abnormal intracranial enhancement is identified. There is no acute infarct. No evidence of intracranial mass. No chronic intracranial blood products. No extra-axial fluid collection. No midline shift. Vascular: Normal proximal arterial flow voids. Expected vascular enhancement. Skull and upper cervical spine: No focal marrow lesion. MRI ORBITS FINDINGS Orbits: There is swelling, T2 hyperintensity and abnormal enhancement of the intraorbital right optic nerve. The globes are normal in size and contour. The extraocular muscles are symmetric and unremarkable. Normal appearance of the left optic nerve sheath complex. Visualized sinuses: Inspissated secretions throughout the left sphenoid sinus. Mild bilateral ethmoid sinus  mucosal thickening. 13 mm focus of polypoid soft tissue within the left nasal passage (series 16, image 5). Soft tissues: The visualized maxillofacial soft tissues are unremarkable. IMPRESSION: MRI brain: Unremarkable MRI appearance of the brain. No evidence of acute intracranial abnormality. MRI orbits: 1. Swelling, T2 hyperintensity and abnormal enhancement of the intraorbital right optic nerve compatible with acute right optic neuritis. 2. Ethmoid and left sphenoid sinusitis. 3. Polypoid soft tissue measuring 13 mm within the left nasal passage. Direct visualization is recommended. Electronically Signed   By: Jackey Loge DO   On: 06/17/2020 14:54     Assessment/Plan Active Problems:   Optic neuritis  1. Optic Neuritis 1. Presents with 3 days of blurry vision, seen by Ophthalmology as outpatient 2. MRI personally reviewed. Findings worrisome for acute R optic neuritis  3. F/u brain MRI reviewed, unremarkable 4. EDP discussed with Neurology who recommended 1gm solumedrol daily x 5 days. Have formally consulted Neurology 5. EDP discussed with Ophthalmologist who will f/u with pt after discharge 6. Admit to med-surg  DVT prophylaxis: Lovenox subq  Code Status: Full Family Communication: Pt in room, family not at bedside  Disposition Plan:   Consults called: Neurology Admission status: Inpatient as pt has acute optic neuritis and will require 5 days of IV solumedrol   Rickey Barbara MD [facility] Pager On Amion  If 7PM-7AM, please contact night-coverage  06/17/2020, 3:54 PM

## 2020-06-17 NOTE — ED Notes (Addendum)
Pt transported to MRI 

## 2020-06-17 NOTE — ED Triage Notes (Addendum)
Pt was sent here yesterday by eye doctor due to diagnosis of optic neuritis and needs an MRI to rule out MS. Pt was here yesterday but left due to MRI machine being down.

## 2020-06-17 NOTE — ED Provider Notes (Signed)
[facility]-EMERGENCY DEPT Provider Note   CSN: [id number] Arrival date & time: 06/17/20  1030     History Chief Complaint  Patient presents with   Eye Pain    Kayla Duarte is a 24 y.o. female with past medical history who presents for evaluation of needing MRI.  Patient sent to emergency department by ophthalmologist, Dr. Clelia Croft.  Patient states 3 days ago she noticed blurred vision to central portion of her right eye.  States this occurs primarily at close-up vision however has normal peripheral vision.  Patient states she also noticed some tenderness surrounding her eye.  Patient thought she needed a new contacts prescription and removed her contacts and started wearing glasses.  She was seen by an optometrist.  Patient then sent to ophthalmology where she was seen yesterday.  Was noted to have trace disc edema and hyperemia of the right eye.  Patient was seen here in the emergency department yesterday however MRI was not working at that time.  They wanted to transfer patient to [facility] for MRI however patient left AGAINST MEDICAL ADVICE.  Patient states she continues to have blurred vision to her right eye.  States her pain has resolved.  No trauma, photophobia, headache, lightheadedness, dizziness, neck pain, neck stiffness, family history of MS or autoimmune disorders.  Was given phenylephrine drops at ophthalmology yesterday and reported that her pain has improved since then.  Denies additional aggravating or alleviating factors.  History obtained from patient and past medical records. No interpreter was used.  Optho- Dr. Sherryll Burger with [facility]   HPI     History reviewed. No pertinent past medical history.  There are no problems to display for this patient.   History reviewed. No pertinent surgical history.   OB History   No obstetric history on file.     History reviewed. No pertinent family history.  Social History   Tobacco Use   Smoking  status: Never Smoker   Smokeless tobacco: Never Used  Substance Use Topics   Alcohol use: Not on file   Drug use: Not on file    Home Medications Prior to Admission medications   Medication Sig Start Date End Date Taking? Authorizing Provider  cyclobenzaprine (FLEXERIL) 10 MG tablet Take 1 tablet (10 mg total) by mouth 2 (two) times daily as needed for muscle spasms. Patient not taking: Reported on 08/26/2019 08/14/19   Fayrene Helper, PA-C  ibuprofen (ADVIL) 600 MG tablet Take 1 tablet (600 mg total) by mouth every 6 (six) hours as needed. 08/14/19   Fayrene Helper, PA-C  methocarbamol (ROBAXIN) 500 MG tablet Take 1 tablet (500 mg total) by mouth 2 (two) times daily as needed. Patient not taking: Reported on 08/26/2019 08/18/19   Cristie Hem, PA-C  predniSONE (STERAPRED UNI-PAK 21 TAB) 10 MG (21) TBPK tablet Take as directed Patient not taking: Reported on 08/26/2019 08/18/19   Cristie Hem, PA-C    Allergies    Patient has no known allergies.  Review of Systems   Review of Systems  Constitutional: Negative.   HENT: Negative.   Eyes: Positive for pain (Resolved) and visual disturbance (Central, "blurriness").  Respiratory: Negative.   Cardiovascular: Negative.   Gastrointestinal: Negative.   Genitourinary: Negative.   Musculoskeletal: Negative.   Skin: Negative.   Neurological: Negative.   All other systems reviewed and are negative.   Physical Exam Updated Vital Signs BP 120/90 (BP Location: Right Arm)    Pulse 60    Temp  98 F (36.7 C) (Oral)    Resp 18    LMP 06/17/2020 (Exact Date)    SpO2 100%   Physical Exam Vitals and nursing note reviewed.  Constitutional:      General: She is not in acute distress.    Appearance: She is well-developed. She is not ill-appearing, toxic-appearing or diaphoretic.     Comments: Texting on initial evaluation  HENT:     Head: Normocephalic and atraumatic.     Mouth/Throat:     Mouth: Mucous membranes are moist.  Eyes:      General: Lids are everted, no foreign bodies appreciated.     Extraocular Movements: Extraocular movements intact.     Conjunctiva/sclera: Conjunctivae normal.     Pupils: Pupils are equal, round, and reactive to light.     Visual Fields: Right eye visual fields normal and left eye visual fields normal.     Comments: Declines tonopen, slit lamp   Cardiovascular:     Rate and Rhythm: Normal rate.     Pulses: Normal pulses.     Heart sounds: Normal heart sounds.  Pulmonary:     Effort: Pulmonary effort is normal. No respiratory distress.     Breath sounds: Normal breath sounds.  Abdominal:     General: Bowel sounds are normal. There is no distension.  Musculoskeletal:        General: Normal range of motion.     Cervical back: Normal range of motion.  Skin:    General: Skin is warm and dry.     Capillary Refill: Capillary refill takes less than 2 seconds.  Neurological:     General: No focal deficit present.     Mental Status: She is alert and oriented to person, place, and time.     Comments: Mental Status:  Alert, oriented, thought content appropriate. Speech fluent without evidence of aphasia. Able to follow 2 step commands without difficulty.  Cranial Nerves:  II:  Peripheral visual fields grossly normal, pupils equal, round, reactive to light III,IV, VI: ptosis not present, extra-ocular motions intact bilaterally  V,VII: smile symmetric, facial light touch sensation equal VIII: hearing grossly normal bilaterally  IX,X: midline uvula rise  XI: bilateral shoulder shrug equal and strong XII: midline tongue extension  Motor:  5/5 in upper and lower extremities bilaterally including strong and equal grip strength and dorsiflexion/plantar flexion Sensory: Pinprick and light touch normal in all extremities.  Deep Tendon Reflexes: 2+ and symmetric  Cerebellar: normal finger-to-nose with bilateral upper extremities Gait: normal gait and balance CV: distal pulses palpable throughout        ED Results / Procedures / Treatments   Labs (all labs ordered are listed, but only abnormal results are displayed) Labs Reviewed  RESP PANEL BY RT-PCR (FLU A&B, COVID) ARPGX2  CBC WITH DIFFERENTIAL/PLATELET  BASIC METABOLIC PANEL  I-STAT BETA HCG BLOOD, ED ([facility], [facility], [facility] ONLY)    EKG None  Radiology MR Brain W and Wo Contrast  Result Date: 06/17/2020 CLINICAL DATA:  Multiple sclerosis, new event. Optic neuritis suspected. Additional history provided: Diagnosed with optic neuritis by eye doctor yesterday, concern for MS, patient reports right eye pain. EXAM: MRI HEAD AND ORBITS WITHOUT AND WITH CONTRAST TECHNIQUE: Multiplanar, multiecho pulse sequences of the brain and surrounding structures were obtained without and with intravenous contrast. Multiplanar, multiecho pulse sequences of the orbits and surrounding structures were obtained including fat saturation techniques, before and after intravenous contrast administration. CONTRAST:  49mL GADAVIST GADOBUTROL 1 MMOL/ML IV SOLN  COMPARISON:  No pertinent prior exams are available for comparison. FINDINGS: MRI HEAD FINDINGS Brain: Mild intermittent motion degradation. Cerebral volume is normal. No focal parenchymal signal abnormality or abnormal intracranial enhancement is identified. There is no acute infarct. No evidence of intracranial mass. No chronic intracranial blood products. No extra-axial fluid collection. No midline shift. Vascular: Normal proximal arterial flow voids. Expected vascular enhancement. Skull and upper cervical spine: No focal marrow lesion. MRI ORBITS FINDINGS Orbits: There is swelling, T2 hyperintensity and abnormal enhancement of the intraorbital right optic nerve. The globes are normal in size and contour. The extraocular muscles are symmetric and unremarkable. Normal appearance of the left optic nerve sheath complex. Visualized sinuses: Inspissated secretions throughout the left sphenoid sinus. Mild bilateral ethmoid  sinus mucosal thickening. 13 mm focus of polypoid soft tissue within the left nasal passage (series 16, image 5). Soft tissues: The visualized maxillofacial soft tissues are unremarkable. IMPRESSION: MRI brain: Unremarkable MRI appearance of the brain. No evidence of acute intracranial abnormality. MRI orbits: 1. Swelling, T2 hyperintensity and abnormal enhancement of the intraorbital right optic nerve compatible with acute right optic neuritis. 2. Ethmoid and left sphenoid sinusitis. 3. Polypoid soft tissue measuring 13 mm within the left nasal passage. Direct visualization is recommended. Electronically Signed   By: Jackey Loge DO   On: 06/17/2020 14:54   MR ORBITS W WO CONTRAST  Result Date: 06/17/2020 CLINICAL DATA:  Multiple sclerosis, new event. Optic neuritis suspected. Additional history provided: Diagnosed with optic neuritis by eye doctor yesterday, concern for MS, patient reports right eye pain. EXAM: MRI HEAD AND ORBITS WITHOUT AND WITH CONTRAST TECHNIQUE: Multiplanar, multiecho pulse sequences of the brain and surrounding structures were obtained without and with intravenous contrast. Multiplanar, multiecho pulse sequences of the orbits and surrounding structures were obtained including fat saturation techniques, before and after intravenous contrast administration. CONTRAST:  28mL GADAVIST GADOBUTROL 1 MMOL/ML IV SOLN COMPARISON:  No pertinent prior exams are available for comparison. FINDINGS: MRI HEAD FINDINGS Brain: Mild intermittent motion degradation. Cerebral volume is normal. No focal parenchymal signal abnormality or abnormal intracranial enhancement is identified. There is no acute infarct. No evidence of intracranial mass. No chronic intracranial blood products. No extra-axial fluid collection. No midline shift. Vascular: Normal proximal arterial flow voids. Expected vascular enhancement. Skull and upper cervical spine: No focal marrow lesion. MRI ORBITS FINDINGS Orbits: There is  swelling, T2 hyperintensity and abnormal enhancement of the intraorbital right optic nerve. The globes are normal in size and contour. The extraocular muscles are symmetric and unremarkable. Normal appearance of the left optic nerve sheath complex. Visualized sinuses: Inspissated secretions throughout the left sphenoid sinus. Mild bilateral ethmoid sinus mucosal thickening. 13 mm focus of polypoid soft tissue within the left nasal passage (series 16, image 5). Soft tissues: The visualized maxillofacial soft tissues are unremarkable. IMPRESSION: MRI brain: Unremarkable MRI appearance of the brain. No evidence of acute intracranial abnormality. MRI orbits: 1. Swelling, T2 hyperintensity and abnormal enhancement of the intraorbital right optic nerve compatible with acute right optic neuritis. 2. Ethmoid and left sphenoid sinusitis. 3. Polypoid soft tissue measuring 13 mm within the left nasal passage. Direct visualization is recommended. Electronically Signed   By: Jackey Loge DO   On: 06/17/2020 14:54    Procedures Procedures (including critical care time)  Medications Ordered in ED Medications  LORazepam (ATIVAN) injection 1 mg (1 mg Intravenous Refused 06/17/20 1139)  methylPREDNISolone sodium succinate (SOLU-MEDROL) 1,000 mg in sodium chloride 0.9 % 50 mL IVPB (has no  administration in time range)  sodium chloride 0.9 % bolus 1,000 mL (0 mLs Intravenous Stopped 06/17/20 1457)  gadobutrol (GADAVIST) 1 MMOL/ML injection 7 mL (7 mLs Intravenous Contrast Given 06/17/20 1426)   ED Course  I have reviewed the triage vital signs and the nursing notes.  Pertinent labs & imaging results that were available during my care of the patient were reviewed by me and considered in my medical decision making (see chart for details).  24 year old presents for evaluation of blurred vision in right eye.  Seen by Dr. Sherryll Burger with ophthalmology yesterday.  Recommend come to emergency department for possible optic  neuritis, MS.  Seen here at [facility] yesterday noted to have MRI down.  They recommended for patient to be transferred to [facility] for MRI however patient declined left AGAINST MEDICAL ADVICE.  She returns today for MRI.  States her pain has resolved to her right eye however she continues to have central blurred vision. Patient denies pain constantly but she states this was done at ophthalmology.  Plan on labs, imaging.  Labs and imaging personally reviewed and interpreted:  CBC without leukocytosis Metabolic panel without electrolyte, renal or normality Pregnancy test negative  MR brain without acute findings MR orbits consistent with optic neuritis  CONSULT with Neuro who recommends inpatient for IV steroids. Personally reviewed MR imaging. No MS lesions on MR brain. Patient does not have paresthesias or weakness in her extremities. States he feels optic neuritis is possible an isolated incident recommends 5 days steroids 1000 mg Methylprednisolone, monitor CBG, CMP and CBC daily. Recommends if hospitalist want official consult can re-consult. He did recommend contacting Optho to see if they follow this outpatient vs Neuro  Discussed results with patient. Agreeable for plan to admit.  CONSULT with Dr. Sherryll Burger with Opthamology where patient was seen previously. He does follow patient however states needs outpatient Neuro follow up as well.  Patient will be admitted for IV steroids for New onset optic neuritis.  The patient appears reasonably stabilized for admission considering the current resources, flow, and capabilities available in the ED at this time, and I doubt any other [facility] requiring further screening and/or treatment in the ED prior to admission.  Patient discussed with attending Dr. Donnald Garre who is in agreement for plan to admit   MDM Rules/Calculators/A&P                           Final Clinical Impression(s) / ED Diagnoses Final diagnoses:  Optic neuritis    Rx  / DC Orders ED Discharge Orders    None       Yogesh Cominsky A, PA-C 06/17/20 1530    Arby Barrette, MD 06/19/20 1119

## 2020-06-18 LAB — COMPREHENSIVE METABOLIC PANEL
ALT: 12 U/L (ref 0–44)
AST: 17 U/L (ref 15–41)
Albumin: 4.3 g/dL (ref 3.5–5.0)
Alkaline Phosphatase: 55 U/L (ref 38–126)
Anion gap: 9 (ref 5–15)
BUN: 10 mg/dL (ref 6–20)
CO2: 21 mmol/L — ABNORMAL LOW (ref 22–32)
Calcium: 9 mg/dL (ref 8.9–10.3)
Chloride: 106 mmol/L (ref 98–111)
Creatinine, Ser: 0.61 mg/dL (ref 0.44–1.00)
GFR, Estimated: >60 mL/min (ref >60)
Glucose, Bld: 119 mg/dL — ABNORMAL HIGH (ref 70–99)
Potassium: 4.3 mmol/L (ref 3.5–5.1)
Sodium: 136 mmol/L (ref 135–145)
Total Bilirubin: 0.6 mg/dL (ref 0.3–1.2)
Total Protein: 7.2 g/dL (ref 6.5–8.1)

## 2020-06-18 LAB — CBC
HCT: 42.9 % (ref 36.0–46.0)
Hemoglobin: 14.1 g/dL (ref 12.0–15.0)
MCH: 29.5 pg (ref 26.0–34.0)
MCHC: 32.9 g/dL (ref 30.0–36.0)
MCV: 89.7 fL (ref 80.0–100.0)
Platelets: 367 10*3/uL (ref 150–400)
RBC: 4.78 MIL/uL (ref 3.87–5.11)
RDW: 13.6 % (ref 11.5–15.5)
WBC: 5.3 10*3/uL (ref 4.0–10.5)
nRBC: 0 % (ref 0.0–0.2)

## 2020-06-18 NOTE — Progress Notes (Signed)
PROGRESS NOTE    Sundeep Cary  BZJ:[id number] DOB: May 24, 1996 DOA: 06/17/2020 PCP: Patient, No Pcp Per    Brief Narrative:  24 y.o. female with no significant past medical history who presents with 3 days of blurry vision in R eye. Pt works for [employer] and reported feeling blurry vision with pain in the R eye. Pt initially attributed discomfort to dust at her workplace. Pt replaced her contact lenses with glasses however symptoms persisted. Pt subsequently presented to her ophthalmologist who then referred pt to ED for further work up. Without chest pain or sob   ED Course: In the Ed, pt underwent MRI with findings of optic neuritis involving R eye. Brain MRI was found to be unremarkable. EDP discussed with neurologist who recommended 5 days of IV solumedrol 1gm. Ophthalmologist was called by EDP who had recommended outpt f/u. Hospitalist consulted for consideration for admission  Assessment & Plan:   Active Problems:   Optic neuritis   1. Optic Neuritis 1. Presents with 3 days of blurry vision, seen by Ophthalmology as outpatient 2. MRI personally reviewed. Findings worrisome for acute R optic neuritis  3. F/u brain MRI reviewed, unremarkable 4. EDP discussed with Neurology who recommended 1gm solumedrol daily x 5 days. Currently on day 2/5 of solumedrol 5. EDP discussed with Ophthalmologist who will f/u with pt after discharge  DVT prophylaxis: Lovenox subq Code Status: Full Family Communication: Pt in room, family not at bedside  Status is: Inpatient  Remains inpatient appropriate because:IV treatments appropriate due to intensity of illness or inability to take PO   Dispo: The patient is from: Home              Anticipated d/c is to: Home              Anticipated d/c date is: 3 days              Patient currently is not medically stable to d/c.       Consultants:     Procedures:     Antimicrobials: Anti-infectives (From admission, onward)   None        Subjective: States R eye pain is improved, still blurry  Objective: Vitals:   06/17/20 2212 06/18/20 0213 06/18/20 0625 06/18/20 1345  BP: 108/78 102/70 (!) 92/54 110/62  Pulse: (!) 59 60 (!) 56 65  Resp: 16 16 16 16   Temp: 98 F (36.7 C) 98 F (36.7 C) 97.9 F (36.6 C) 98.2 F (36.8 C)  TempSrc: Oral Oral Oral Oral  SpO2: 100% 100% 98% 100%  Weight:      Height:        Intake/Output Summary (Last 24 hours) at 06/18/2020 1811 Last data filed at 06/18/2020 0930 Gross per 24 hour  Intake 360 ml  Output --  Net 360 ml   Filed Weights   06/17/20 1825  Weight: 96.8 kg    Examination: General exam: Appears calm and comfortable  Respiratory system: Clear to auscultation. Respiratory effort normal. Cardiovascular system: S1 & S2 heard, Regular Gastrointestinal system: Abdomen is nondistended, soft and nontender. No organomegaly or masses felt. Normal bowel sounds heard. Central nervous system: Alert and oriented. No focal neurological deficits. Extremities: Symmetric 5 x 5 power. Skin: No rashes, lesions Psychiatry: Judgement and insight appear normal. Mood & affect appropriate.   Data Reviewed: I have personally reviewed following labs and imaging studies  CBC: Recent Labs  Lab 06/17/20 1120 06/18/20 0415  WBC 4.3 5.3  NEUTROABS 2.2  --  HGB 13.9 14.1  HCT 42.7 42.9  MCV 89.9 89.7  PLT 355 367   Basic Metabolic Panel: Recent Labs  Lab 06/17/20 1120 06/18/20 0415  NA 138 136  K 4.4 4.3  CL 106 106  CO2 25 21*  GLUCOSE 87 119*  BUN 7 10  CREATININE 0.59 0.61  CALCIUM 9.1 9.0   GFR: Estimated Creatinine Clearance: 134.3 mL/min (by C-G formula based on SCr of 0.61 mg/dL). Liver Function Tests: Recent Labs  Lab 06/18/20 0415  AST 17  ALT 12  ALKPHOS 55  BILITOT 0.6  PROT 7.2  ALBUMIN 4.3   No results for input(s): LIPASE, AMYLASE in the last 168 hours. No results for input(s): AMMONIA in the last 168 hours. Coagulation Profile: No  results for input(s): INR, PROTIME in the last 168 hours. Cardiac Enzymes: No results for input(s): CKTOTAL, CKMB, CKMBINDEX, TROPONINI in the last 168 hours. BNP (last 3 results) No results for input(s): PROBNP in the last 8760 hours. HbA1C: No results for input(s): HGBA1C in the last 72 hours. CBG: No results for input(s): GLUCAP in the last 168 hours. Lipid Profile: No results for input(s): CHOL, HDL, LDLCALC, TRIG, CHOLHDL, LDLDIRECT in the last 72 hours. Thyroid Function Tests: No results for input(s): TSH, T4TOTAL, FREET4, T3FREE, THYROIDAB in the last 72 hours. Anemia Panel: No results for input(s): VITAMINB12, FOLATE, FERRITIN, TIBC, IRON, RETICCTPCT in the last 72 hours. Sepsis Labs: No results for input(s): PROCALCITON, LATICACIDVEN in the last 168 hours.  Recent Results (from the past 240 hour(s))  Resp Panel by RT-PCR (Flu A&B, Covid) Nasopharyngeal Swab     Status: None   Collection Time: 06/17/20  4:22 PM   Specimen: Nasopharyngeal Swab; Nasopharyngeal(NP) swabs in vial transport medium  Result Value Ref Range Status   SARS Coronavirus 2 by RT PCR NEGATIVE NEGATIVE Final    Comment: (NOTE) SARS-CoV-2 target nucleic acids are NOT DETECTED.  The SARS-CoV-2 RNA is generally detectable in upper respiratory specimens during the acute phase of infection. The lowest concentration of SARS-CoV-2 viral copies this assay can detect is 138 copies/mL. A negative result does not preclude SARS-Cov-2 infection and should not be used as the sole basis for treatment or other patient management decisions. A negative result may occur with  improper specimen collection/handling, submission of specimen other than nasopharyngeal swab, presence of viral mutation(s) within the areas targeted by this assay, and inadequate number of viral copies(<138 copies/mL). A negative result must be combined with clinical observations, patient history, and epidemiological information. The expected  result is Negative.  Fact Sheet for Patients:  BloggerCourse.com  Fact Sheet for Healthcare Providers:  SeriousBroker.it  This test is no t yet approved or cleared by the [place] FDA and  has been authorized for detection and/or diagnosis of SARS-CoV-2 by FDA under an Emergency Use Authorization (EUA). This EUA will remain  in effect (meaning this test can be used) for the duration of the COVID-19 declaration under Section 564(b)(1) of the Act, 21 U.S.C.section 360bbb-3(b)(1), unless the authorization is terminated  or revoked sooner.       Influenza A by PCR NEGATIVE NEGATIVE Final   Influenza B by PCR NEGATIVE NEGATIVE Final    Comment: (NOTE) The Xpert Xpress SARS-CoV-2/FLU/RSV plus assay is intended as an aid in the diagnosis of influenza from Nasopharyngeal swab specimens and should not be used as a sole basis for treatment. Nasal washings and aspirates are unacceptable for Xpert Xpress SARS-CoV-2/FLU/RSV testing.  Fact Sheet for Patients: BloggerCourse.com  Fact Sheet for Healthcare Providers: SeriousBroker.it  This test is not yet approved or cleared by the [place] FDA and has been authorized for detection and/or diagnosis of SARS-CoV-2 by FDA under an Emergency Use Authorization (EUA). This EUA will remain in effect (meaning this test can be used) for the duration of the COVID-19 declaration under Section 564(b)(1) of the Act, 21 U.S.C. section 360bbb-3(b)(1), unless the authorization is terminated or revoked.  Performed at [facility], [address] [place], [place] [address]      Radiology Studies: MR Brain W and Wo Contrast  Result Date: 06/17/2020 CLINICAL DATA:  Multiple sclerosis, new event. Optic neuritis suspected. Additional history provided: Diagnosed with optic neuritis by eye doctor yesterday, concern for MS, patient  reports right eye pain. EXAM: MRI HEAD AND ORBITS WITHOUT AND WITH CONTRAST TECHNIQUE: Multiplanar, multiecho pulse sequences of the brain and surrounding structures were obtained without and with intravenous contrast. Multiplanar, multiecho pulse sequences of the orbits and surrounding structures were obtained including fat saturation techniques, before and after intravenous contrast administration. CONTRAST:  7mL GADAVIST GADOBUTROL 1 MMOL/ML IV SOLN COMPARISON:  No pertinent prior exams are available for comparison. FINDINGS: MRI HEAD FINDINGS Brain: Mild intermittent motion degradation. Cerebral volume is normal. No focal parenchymal signal abnormality or abnormal intracranial enhancement is identified. There is no acute infarct. No evidence of intracranial mass. No chronic intracranial blood products. No extra-axial fluid collection. No midline shift. Vascular: Normal proximal arterial flow voids. Expected vascular enhancement. Skull and upper cervical spine: No focal marrow lesion. MRI ORBITS FINDINGS Orbits: There is swelling, T2 hyperintensity and abnormal enhancement of the intraorbital right optic nerve. The globes are normal in size and contour. The extraocular muscles are symmetric and unremarkable. Normal appearance of the left optic nerve sheath complex. Visualized sinuses: Inspissated secretions throughout the left sphenoid sinus. Mild bilateral ethmoid sinus mucosal thickening. 13 mm focus of polypoid soft tissue within the left nasal passage (series 16, image 5). Soft tissues: The visualized maxillofacial soft tissues are unremarkable. IMPRESSION: MRI brain: Unremarkable MRI appearance of the brain. No evidence of acute intracranial abnormality. MRI orbits: 1. Swelling, T2 hyperintensity and abnormal enhancement of the intraorbital right optic nerve compatible with acute right optic neuritis. 2. Ethmoid and left sphenoid sinusitis. 3. Polypoid soft tissue measuring 13 mm within the left nasal  passage. Direct visualization is recommended. Electronically Signed   By: Jackey Loge DO   On: 06/17/2020 14:54   MR ORBITS W WO CONTRAST  Result Date: 06/17/2020 CLINICAL DATA:  Multiple sclerosis, new event. Optic neuritis suspected. Additional history provided: Diagnosed with optic neuritis by eye doctor yesterday, concern for MS, patient reports right eye pain. EXAM: MRI HEAD AND ORBITS WITHOUT AND WITH CONTRAST TECHNIQUE: Multiplanar, multiecho pulse sequences of the brain and surrounding structures were obtained without and with intravenous contrast. Multiplanar, multiecho pulse sequences of the orbits and surrounding structures were obtained including fat saturation techniques, before and after intravenous contrast administration. CONTRAST:  7mL GADAVIST GADOBUTROL 1 MMOL/ML IV SOLN COMPARISON:  No pertinent prior exams are available for comparison. FINDINGS: MRI HEAD FINDINGS Brain: Mild intermittent motion degradation. Cerebral volume is normal. No focal parenchymal signal abnormality or abnormal intracranial enhancement is identified. There is no acute infarct. No evidence of intracranial mass. No chronic intracranial blood products. No extra-axial fluid collection. No midline shift. Vascular: Normal proximal arterial flow voids. Expected vascular enhancement. Skull and upper cervical spine: No focal marrow lesion. MRI ORBITS FINDINGS Orbits: There is  swelling, T2 hyperintensity and abnormal enhancement of the intraorbital right optic nerve. The globes are normal in size and contour. The extraocular muscles are symmetric and unremarkable. Normal appearance of the left optic nerve sheath complex. Visualized sinuses: Inspissated secretions throughout the left sphenoid sinus. Mild bilateral ethmoid sinus mucosal thickening. 13 mm focus of polypoid soft tissue within the left nasal passage (series 16, image 5). Soft tissues: The visualized maxillofacial soft tissues are unremarkable. IMPRESSION: MRI  brain: Unremarkable MRI appearance of the brain. No evidence of acute intracranial abnormality. MRI orbits: 1. Swelling, T2 hyperintensity and abnormal enhancement of the intraorbital right optic nerve compatible with acute right optic neuritis. 2. Ethmoid and left sphenoid sinusitis. 3. Polypoid soft tissue measuring 13 mm within the left nasal passage. Direct visualization is recommended. Electronically Signed   By: Jackey Loge DO   On: 06/17/2020 14:54    Scheduled Meds: . enoxaparin (LOVENOX) injection  40 mg Subcutaneous Q24H  . LORazepam  1 mg Intravenous Once   Continuous Infusions: . methylPREDNISolone (SOLU-MEDROL) injection 1,000 mg (06/18/20 1244)     LOS: 1 day   Rickey Barbara, MD [facility] Pager On Amion  If 7PM-7AM, please contact night-coverage 06/18/2020, 6:11 PM

## 2020-06-19 DIAGNOSIS — E669 Obesity, unspecified: Secondary | ICD-10-CM

## 2020-06-19 NOTE — Progress Notes (Signed)
PROGRESS NOTE  Kayla Duarte NOM:[id number] DOB: May 19, 1996   PCP: Patient, No Pcp Per  Patient is from: Home  DOA: 06/17/2020 LOS: 2  Chief complaints: Blurry vision  Brief Narrative / Interim history: 24 year old female with no significant past medical history presenting from ophthalmology office with blurry vision in right eye, and found to have optic neuritis on MRI.  She was a started on IV Solu-Medrol 1 g daily by neurologist for a total of 5 days.  Subjective: Seen and examined earlier this morning.  No major events overnight or this morning.  Continues to endorse blurry vision in right eye.  Asking why she cannot go home and come back for a IV medication. She is is unhappy about staying the whole 5 days to complete the IV Solu-Medrol.  We have discussed the importance of this.  She voiced understanding but undecided about staying or leaving.   Objective: Vitals:   06/18/20 1345 06/18/20 2002 06/19/20 0439 06/19/20 1305  BP: 110/62 116/72 (!) 103/55 114/62  Pulse: 65 70 63 70  Resp: 16 20 16 18   Temp: 98.2 F (36.8 C) 98 F (36.7 C) 98 F (36.7 C) 98.1 F (36.7 C)  TempSrc: Oral Oral Oral Oral  SpO2: 100% 97% 99% 99%  Weight:      Height:        Intake/Output Summary (Last 24 hours) at 06/19/2020 1518 Last data filed at 06/18/2020 2111 Gross per 24 hour  Intake 118 ml  Output --  Net 118 ml   Filed Weights   06/17/20 1825  Weight: 96.8 kg    Examination:  GENERAL: No apparent distress.  Nontoxic. HEENT: MMM.  Vision and hearing grossly intact.  NECK: Supple.  No apparent JVD.  RESP:  No IWOB.  Fair aeration bilaterally. CVS:  RRR. Heart sounds normal.  ABD/GI/GU: BS+. Abd soft, NTND.  MSK/EXT:  Moves extremities. No apparent deformity. No edema.  SKIN: no apparent skin lesion or wound NEURO: Awake, alert and oriented appropriately.  Reports blurry vision in right eye.  No apparent focal neuro deficit. PSYCH: Calm. Normal affect.  Procedures:   None  Microbiology summarized: COVID-19, influenza and RSV PCR nonreactive.  Assessment & Plan: Optic neuritis: presents with blurry vision in right eye.  MRI brain and orbits worrisome for acute intraorbital right optic neuritis but unremarkable appearance of the brain -Continue IV Solu-Medrol 1 g daily for 5 days through 11/23 -EDP discussed with ophthalmologist who will follow up with patient after discharge. -Needs outpatient follow-up with neurology  Class I obesity Body mass index is 32.45 kg/m.  -Encourage lifestyle change to lose weight.       DVT prophylaxis:  enoxaparin (LOVENOX) injection 40 mg Start: 06/17/20 2200  Code Status: Full code Family Communication: Patient and/or RN. Available if any question.  Status is: Inpatient  Remains inpatient appropriate because:IV treatments appropriate due to intensity of illness or inability to take PO and Inpatient level of care appropriate due to severity of illness   Dispo: The patient is from: Home              Anticipated d/c is to: Home              Anticipated d/c date is: 2 days              Patient currently is not medically stable to d/c.       Consultants:  Neurology Ophthalmology   Sch Meds:  Scheduled Meds: . enoxaparin (LOVENOX) injection  40 mg Subcutaneous Q24H  . LORazepam  1 mg Intravenous Once   Continuous Infusions: . methylPREDNISolone (SOLU-MEDROL) injection 1,000 mg (06/19/20 1042)   PRN Meds:.acetaminophen **OR** acetaminophen  Antimicrobials: Anti-infectives (From admission, onward)   None       I have personally reviewed the following labs and images: CBC: Recent Labs  Lab 06/17/20 1120 06/18/20 0415  WBC 4.3 5.3  NEUTROABS 2.2  --   HGB 13.9 14.1  HCT 42.7 42.9  MCV 89.9 89.7  PLT 355 367   BMP &GFR Recent Labs  Lab 06/17/20 1120 06/18/20 0415  NA 138 136  K 4.4 4.3  CL 106 106  CO2 25 21*  GLUCOSE 87 119*  BUN 7 10  CREATININE 0.59 0.61  CALCIUM 9.1 9.0    Estimated Creatinine Clearance: 134.3 mL/min (by C-G formula based on SCr of 0.61 mg/dL). Liver & Pancreas: Recent Labs  Lab 06/18/20 0415  AST 17  ALT 12  ALKPHOS 55  BILITOT 0.6  PROT 7.2  ALBUMIN 4.3   No results for input(s): LIPASE, AMYLASE in the last 168 hours. No results for input(s): AMMONIA in the last 168 hours. Diabetic: No results for input(s): HGBA1C in the last 72 hours. No results for input(s): GLUCAP in the last 168 hours. Cardiac Enzymes: No results for input(s): CKTOTAL, CKMB, CKMBINDEX, TROPONINI in the last 168 hours. No results for input(s): PROBNP in the last 8760 hours. Coagulation Profile: No results for input(s): INR, PROTIME in the last 168 hours. Thyroid Function Tests: No results for input(s): TSH, T4TOTAL, FREET4, T3FREE, THYROIDAB in the last 72 hours. Lipid Profile: No results for input(s): CHOL, HDL, LDLCALC, TRIG, CHOLHDL, LDLDIRECT in the last 72 hours. Anemia Panel: No results for input(s): VITAMINB12, FOLATE, FERRITIN, TIBC, IRON, RETICCTPCT in the last 72 hours. Urine analysis: No results found for: COLORURINE, APPEARANCEUR, LABSPEC, PHURINE, GLUCOSEU, HGBUR, BILIRUBINUR, KETONESUR, PROTEINUR, UROBILINOGEN, NITRITE, LEUKOCYTESUR Sepsis Labs: Invalid input(s): PROCALCITONIN, LACTICIDVEN  Microbiology: Recent Results (from the past 240 hour(s))  Resp Panel by RT-PCR (Flu A&B, Covid) Nasopharyngeal Swab     Status: None   Collection Time: 06/17/20  4:22 PM   Specimen: Nasopharyngeal Swab; Nasopharyngeal(NP) swabs in vial transport medium  Result Value Ref Range Status   SARS Coronavirus 2 by RT PCR NEGATIVE NEGATIVE Final    Comment: (NOTE) SARS-CoV-2 target nucleic acids are NOT DETECTED.  The SARS-CoV-2 RNA is generally detectable in upper respiratory specimens during the acute phase of infection. The lowest concentration of SARS-CoV-2 viral copies this assay can detect is 138 copies/mL. A negative result does not preclude  SARS-Cov-2 infection and should not be used as the sole basis for treatment or other patient management decisions. A negative result may occur with  improper specimen collection/handling, submission of specimen other than nasopharyngeal swab, presence of viral mutation(s) within the areas targeted by this assay, and inadequate number of viral copies(<138 copies/mL). A negative result must be combined with clinical observations, patient history, and epidemiological information. The expected result is Negative.  Fact Sheet for Patients:  BloggerCourse.com  Fact Sheet for Healthcare Providers:  SeriousBroker.it  This test is no t yet approved or cleared by the [place] FDA and  has been authorized for detection and/or diagnosis of SARS-CoV-2 by FDA under an Emergency Use Authorization (EUA). This EUA will remain  in effect (meaning this test can be used) for the duration of the COVID-19 declaration under Section 564(b)(1) of the Act, 21 U.S.C.section 360bbb-3(b)(1), unless the authorization is terminated  or revoked sooner.       Influenza A by PCR NEGATIVE NEGATIVE Final   Influenza B by PCR NEGATIVE NEGATIVE Final    Comment: (NOTE) The Xpert Xpress SARS-CoV-2/FLU/RSV plus assay is intended as an aid in the diagnosis of influenza from Nasopharyngeal swab specimens and should not be used as a sole basis for treatment. Nasal washings and aspirates are unacceptable for Xpert Xpress SARS-CoV-2/FLU/RSV testing.  Fact Sheet for Patients: BloggerCourse.com  Fact Sheet for Healthcare Providers: SeriousBroker.it  This test is not yet approved or cleared by the [place] FDA and has been authorized for detection and/or diagnosis of SARS-CoV-2 by FDA under an Emergency Use Authorization (EUA). This EUA will remain in effect (meaning this test can be used) for the duration of  the COVID-19 declaration under Section 564(b)(1) of the Act, 21 U.S.C. section 360bbb-3(b)(1), unless the authorization is terminated or revoked.  Performed at [facility], [address] [place], [place] [address]     Radiology Studies: No results found.    Clearnce Leja T. Justin Buechner [facility]  If 7PM-7AM, please contact night-coverage [email or web address] 06/19/2020, 3:18 PM

## 2020-06-20 NOTE — Plan of Care (Signed)

## 2020-06-20 NOTE — Progress Notes (Signed)
PROGRESS NOTE  Kayla Duarte TMA:[id number] DOB: 22-Jul-1996   PCP: Patient, No Pcp Per  Patient is from: Home  DOA: 06/17/2020 LOS: 3  Chief complaints: Blurry vision  Brief Narrative / Interim history: 24 year old female with no significant past medical history presenting from ophthalmology office with blurry vision in right eye, and found to have optic neuritis on MRI.  She was a started on IV Solu-Medrol 1 g daily by neurologist for a total of 5 days through 06/21/2020..  Subjective: Seen and examined earlier this morning.  No major events overnight of this morning.  No complaints.  Still with blurry vision in right eye.  No other new symptoms.  Objective: Vitals:   06/19/20 1305 06/19/20 2011 06/20/20 0428 06/20/20 1313  BP: 114/62 111/80 (!) 102/55 (!) 111/55  Pulse: 70 (!) 57 (!) 47 (!) 58  Resp: 18 16 16 18   Temp: 98.1 F (36.7 C) 97.6 F (36.4 C) 98.2 F (36.8 C) 98.8 F (37.1 C)  TempSrc: Oral Oral Oral Oral  SpO2: 99% 97% 98% 97%  Weight:      Height:       No intake or output data in the 24 hours ending 06/20/20 1340 Filed Weights   06/17/20 1825  Weight: 96.8 kg    Examination:  GENERAL: No apparent distress.  Nontoxic. HEENT: MMM.  Vision and hearing grossly intact.  NECK: Supple.  No apparent JVD.  RESP:  No IWOB.  Fair aeration bilaterally. CVS:  RRR. Heart sounds normal.  ABD/GI/GU: BS+. Abd soft, NTND.  MSK/EXT:  Moves extremities. No apparent deformity. No edema.  SKIN: no apparent skin lesion or wound NEURO: Awake, alert and oriented appropriately.  Reports blurry vision in right eye. PSYCH: Calm. Normal affect.  Procedures:  None  Microbiology summarized: COVID-19, influenza and RSV PCR nonreactive.  Assessment & Plan: Optic neuritis: presents with blurry vision in right eye.  MRI brain and orbits worrisome for acute intraorbital right optic neuritis but unremarkable appearance of the brain -Continue IV Solu-Medrol 1 g daily for 5 days  through 06/21/2020 per neurology recommendation -EDP discussed with ophthalmologist who will follow up with patient after discharge. -Needs outpatient follow-up with neurology  Class I obesity Body mass index is 32.45 kg/m.  -Encourage lifestyle change to lose weight.       DVT prophylaxis:  enoxaparin (LOVENOX) injection 40 mg Start: 06/17/20 2200  Code Status: Full code Family Communication: Patient and/or RN. Available if any question.  Status is: Inpatient  Remains inpatient appropriate because:IV treatments appropriate due to intensity of illness or inability to take PO and Inpatient level of care appropriate due to severity of illness   Dispo: The patient is from: Home              Anticipated d/c is to: Home              Anticipated d/c date is: 1 day              Patient currently is not medically stable to d/c.       Consultants:  Neurology Ophthalmology   Sch Meds:  Scheduled Meds: . enoxaparin (LOVENOX) injection  40 mg Subcutaneous Q24H  . LORazepam  1 mg Intravenous Once   Continuous Infusions: . methylPREDNISolone (SOLU-MEDROL) injection 1,000 mg (06/20/20 1122)   PRN Meds:.acetaminophen **OR** acetaminophen  Antimicrobials: Anti-infectives (From admission, onward)   None       I have personally reviewed the following labs and images: CBC: Recent Labs  Lab 06/17/20 1120 06/18/20 0415  WBC 4.3 5.3  NEUTROABS 2.2  --   HGB 13.9 14.1  HCT 42.7 42.9  MCV 89.9 89.7  PLT 355 367   BMP &GFR Recent Labs  Lab 06/17/20 1120 06/18/20 0415  NA 138 136  K 4.4 4.3  CL 106 106  CO2 25 21*  GLUCOSE 87 119*  BUN 7 10  CREATININE 0.59 0.61  CALCIUM 9.1 9.0   Estimated Creatinine Clearance: 134.3 mL/min (by C-G formula based on SCr of 0.61 mg/dL). Liver & Pancreas: Recent Labs  Lab 06/18/20 0415  AST 17  ALT 12  ALKPHOS 55  BILITOT 0.6  PROT 7.2  ALBUMIN 4.3   No results for input(s): LIPASE, AMYLASE in the last 168 hours. No  results for input(s): AMMONIA in the last 168 hours. Diabetic: No results for input(s): HGBA1C in the last 72 hours. No results for input(s): GLUCAP in the last 168 hours. Cardiac Enzymes: No results for input(s): CKTOTAL, CKMB, CKMBINDEX, TROPONINI in the last 168 hours. No results for input(s): PROBNP in the last 8760 hours. Coagulation Profile: No results for input(s): INR, PROTIME in the last 168 hours. Thyroid Function Tests: No results for input(s): TSH, T4TOTAL, FREET4, T3FREE, THYROIDAB in the last 72 hours. Lipid Profile: No results for input(s): CHOL, HDL, LDLCALC, TRIG, CHOLHDL, LDLDIRECT in the last 72 hours. Anemia Panel: No results for input(s): VITAMINB12, FOLATE, FERRITIN, TIBC, IRON, RETICCTPCT in the last 72 hours. Urine analysis: No results found for: COLORURINE, APPEARANCEUR, LABSPEC, PHURINE, GLUCOSEU, HGBUR, BILIRUBINUR, KETONESUR, PROTEINUR, UROBILINOGEN, NITRITE, LEUKOCYTESUR Sepsis Labs: Invalid input(s): PROCALCITONIN, LACTICIDVEN  Microbiology: Recent Results (from the past 240 hour(s))  Resp Panel by RT-PCR (Flu A&B, Covid) Nasopharyngeal Swab     Status: None   Collection Time: 06/17/20  4:22 PM   Specimen: Nasopharyngeal Swab; Nasopharyngeal(NP) swabs in vial transport medium  Result Value Ref Range Status   SARS Coronavirus 2 by RT PCR NEGATIVE NEGATIVE Final    Comment: (NOTE) SARS-CoV-2 target nucleic acids are NOT DETECTED.  The SARS-CoV-2 RNA is generally detectable in upper respiratory specimens during the acute phase of infection. The lowest concentration of SARS-CoV-2 viral copies this assay can detect is 138 copies/mL. A negative result does not preclude SARS-Cov-2 infection and should not be used as the sole basis for treatment or other patient management decisions. A negative result may occur with  improper specimen collection/handling, submission of specimen other than nasopharyngeal swab, presence of viral mutation(s) within the areas  targeted by this assay, and inadequate number of viral copies(<138 copies/mL). A negative result must be combined with clinical observations, patient history, and epidemiological information. The expected result is Negative.  Fact Sheet for Patients:  BloggerCourse.com  Fact Sheet for Healthcare Providers:  SeriousBroker.it  This test is no t yet approved or cleared by the [place] FDA and  has been authorized for detection and/or diagnosis of SARS-CoV-2 by FDA under an Emergency Use Authorization (EUA). This EUA will remain  in effect (meaning this test can be used) for the duration of the COVID-19 declaration under Section 564(b)(1) of the Act, 21 U.S.C.section 360bbb-3(b)(1), unless the authorization is terminated  or revoked sooner.       Influenza A by PCR NEGATIVE NEGATIVE Final   Influenza B by PCR NEGATIVE NEGATIVE Final    Comment: (NOTE) The Xpert Xpress SARS-CoV-2/FLU/RSV plus assay is intended as an aid in the diagnosis of influenza from Nasopharyngeal swab specimens and should not be used as a  sole basis for treatment. Nasal washings and aspirates are unacceptable for Xpert Xpress SARS-CoV-2/FLU/RSV testing.  Fact Sheet for Patients: BloggerCourse.com  Fact Sheet for Healthcare Providers: SeriousBroker.it  This test is not yet approved or cleared by the [place] FDA and has been authorized for detection and/or diagnosis of SARS-CoV-2 by FDA under an Emergency Use Authorization (EUA). This EUA will remain in effect (meaning this test can be used) for the duration of the COVID-19 declaration under Section 564(b)(1) of the Act, 21 U.S.C. section 360bbb-3(b)(1), unless the authorization is terminated or revoked.  Performed at [facility], [address], [place], [place] [address]     Radiology Studies: No results found.    Haunani Dickard  T. Tedra Coppernoll [facility]  If 7PM-7AM, please contact night-coverage [email or web address] 06/20/2020, 1:40 PM

## 2020-06-20 NOTE — Progress Notes (Signed)
Went to pt's room to restart IV. Pt. Stated "My IV isn't hurting anymore. I don't need a new one."

## 2020-06-21 NOTE — TOC Transition Note (Signed)
Transition of Care [facility]) - CM/SW Discharge Note   Patient Details  Name: Kayla Duarte MRN: [id number] Date of Birth: 1995/08/23  [facility] CM/SW Contact:  Ida Rogue, LCSW Phone Number: 06/21/2020, 10:01 AM   Clinical Narrative:   Patient seen in follow up to consult re: need for PCP.  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  She confirms that she will be staying in the area rather than returning to home in [place], and is open to seeing a PCP in the [facility] system. Has transportation.  [facility]. No further needs identified. TOC sign off.    Final next level of care: Home/Self Care Barriers to Discharge: No Barriers Identified   Patient Goals and CMS Choice        Discharge Placement                       Discharge Plan and Services                                     Social Determinants of Health (SDOH) Interventions     Readmission Risk Interventions No flowsheet data found.

## 2020-06-21 NOTE — Discharge Summary (Signed)
Physician Discharge Summary  Kayla Duarte JXB:[id number] DOB: [birth year] DOA: 06/17/2020  PCP: Patient, No Pcp Per  Admit date: 06/17/2020 Discharge date: 06/21/2020  Admitted From: Home Disposition: Home  Recommendations for Outpatient Follow-up:  1. Follow ups as below. 2. Please obtain CBC/BMP/Mag at follow up 3. Please follow up on the following pending results: None  Home Health: None required Equipment/Devices: None required  Discharge Condition: Stable CODE STATUS: Full code   Follow-up Information    [facility] Follow up on 06/28/2020.   Specialty: Internal Medicine Why: Tuesday at 1PM for your hospital follow up appointment.  Please call to reschedule if this time is not convenient for you. Contact information: [address] [place] [address] [phone]              Hospital Course: 24 year old female with no significant past medical history presenting from ophthalmology office with blurry vision in right eye, and found to have optic neuritis on MRI brain and MRI orbit.  She was a started on IV Solu-Medrol 1 g daily by neurologist for a total of 5 days through 06/21/2020 that she completed in-house.  Blurry vision improved but has not resolved.  Patient to follow-up with neurology and ophthalmology outpatient.  Ambulatory referral to neurology ordered.  She has ophthalmologist she can follow-up with.  See individual problem list below for more on hospital course.  Discharge Diagnoses:  Optic neuritis: presents with blurry vision in right eye.  MRI brain and orbits worrisome for acute intraorbital right optic neuritis but unremarkable appearance of the brain -Continue IV Solu-Medrol 1 g daily for 5 days, 11/19-11/23 per neurology recommendation. -EDP discussed with ophthalmologist who will follow up with patient after discharge. -Ambulatory referral to neurology  Class I obesity Body mass index is 32.45 kg/m.   -Encourage lifestyle change to lose weight.          Discharge Exam: Vitals:   06/21/20 0359 06/21/20 0410  BP: (!) 97/56   Pulse: (!) 43 (!) 53  Resp: 20   Temp: 97.8 F (36.6 C)   SpO2: 98%     GENERAL: No apparent distress.  Nontoxic. HEENT: MMM.  Vision and hearing grossly intact.  NECK: Supple.  No apparent JVD.  RESP:  No IWOB.  Fair aeration bilaterally. CVS:  RRR. Heart sounds normal.  ABD/GI/GU: Bowel sounds present. Soft. Non tender.  MSK/EXT:  Moves extremities. No apparent deformity. No edema.  SKIN: no apparent skin lesion or wound NEURO: Awake, alert and oriented appropriately.  No apparent focal neuro deficit other than residual blurry vision from right eye. PSYCH: Calm. Normal affect.  Discharge Instructions  Discharge Instructions    Ambulatory referral to Neurology   Complete by: As directed    An appointment is requested in approximately: 2 weeks   Diet general   Complete by: As directed    Discharge instructions   Complete by: As directed    It has been a pleasure taking care of you!  You were hospitalized due to blurry vision in the right eye which is due to optic neuritis.  You were treated with steroid as recommended by neurology.  Please follow-up with neurology and ophthalmology.  We have sent referral to neurology.   Take care,   Increase activity slowly   Complete by: As directed      Allergies as of 06/21/2020   No Known Allergies     Medication List    STOP taking these  medications   cyclobenzaprine 10 MG tablet Commonly known as: FLEXERIL   ibuprofen 600 MG tablet Commonly known as: ADVIL   methocarbamol 500 MG tablet Commonly known as: Robaxin   predniSONE 10 MG (21) Tbpk tablet Commonly known as: STERAPRED UNI-PAK 21 TAB       Consultations:  Neurology  Ophthalmology  Procedures/Studies:   MR Brain W and Wo Contrast  Result Date: 06/17/2020 CLINICAL DATA:  Multiple sclerosis, new event. Optic neuritis  suspected. Additional history provided: Diagnosed with optic neuritis by eye doctor yesterday, concern for MS, patient reports right eye pain. EXAM: MRI HEAD AND ORBITS WITHOUT AND WITH CONTRAST TECHNIQUE: Multiplanar, multiecho pulse sequences of the brain and surrounding structures were obtained without and with intravenous contrast. Multiplanar, multiecho pulse sequences of the orbits and surrounding structures were obtained including fat saturation techniques, before and after intravenous contrast administration. CONTRAST:  7mL GADAVIST GADOBUTROL 1 MMOL/ML IV SOLN COMPARISON:  No pertinent prior exams are available for comparison. FINDINGS: MRI HEAD FINDINGS Brain: Mild intermittent motion degradation. Cerebral volume is normal. No focal parenchymal signal abnormality or abnormal intracranial enhancement is identified. There is no acute infarct. No evidence of intracranial mass. No chronic intracranial blood products. No extra-axial fluid collection. No midline shift. Vascular: Normal proximal arterial flow voids. Expected vascular enhancement. Skull and upper cervical spine: No focal marrow lesion. MRI ORBITS FINDINGS Orbits: There is swelling, T2 hyperintensity and abnormal enhancement of the intraorbital right optic nerve. The globes are normal in size and contour. The extraocular muscles are symmetric and unremarkable. Normal appearance of the left optic nerve sheath complex. Visualized sinuses: Inspissated secretions throughout the left sphenoid sinus. Mild bilateral ethmoid sinus mucosal thickening. 13 mm focus of polypoid soft tissue within the left nasal passage (series 16, image 5). Soft tissues: The visualized maxillofacial soft tissues are unremarkable. IMPRESSION: MRI brain: Unremarkable MRI appearance of the brain. No evidence of acute intracranial abnormality. MRI orbits: 1. Swelling, T2 hyperintensity and abnormal enhancement of the intraorbital right optic nerve compatible with acute right optic  neuritis. 2. Ethmoid and left sphenoid sinusitis. 3. Polypoid soft tissue measuring 13 mm within the left nasal passage. Direct visualization is recommended. Electronically Signed   By: Jackey Loge DO   On: 06/17/2020 14:54   MR ORBITS W WO CONTRAST  Result Date: 06/17/2020 CLINICAL DATA:  Multiple sclerosis, new event. Optic neuritis suspected. Additional history provided: Diagnosed with optic neuritis by eye doctor yesterday, concern for MS, patient reports right eye pain. EXAM: MRI HEAD AND ORBITS WITHOUT AND WITH CONTRAST TECHNIQUE: Multiplanar, multiecho pulse sequences of the brain and surrounding structures were obtained without and with intravenous contrast. Multiplanar, multiecho pulse sequences of the orbits and surrounding structures were obtained including fat saturation techniques, before and after intravenous contrast administration. CONTRAST:  7mL GADAVIST GADOBUTROL 1 MMOL/ML IV SOLN COMPARISON:  No pertinent prior exams are available for comparison. FINDINGS: MRI HEAD FINDINGS Brain: Mild intermittent motion degradation. Cerebral volume is normal. No focal parenchymal signal abnormality or abnormal intracranial enhancement is identified. There is no acute infarct. No evidence of intracranial mass. No chronic intracranial blood products. No extra-axial fluid collection. No midline shift. Vascular: Normal proximal arterial flow voids. Expected vascular enhancement. Skull and upper cervical spine: No focal marrow lesion. MRI ORBITS FINDINGS Orbits: There is swelling, T2 hyperintensity and abnormal enhancement of the intraorbital right optic nerve. The globes are normal in size and contour. The extraocular muscles are symmetric and unremarkable. Normal appearance of the left optic nerve  sheath complex. Visualized sinuses: Inspissated secretions throughout the left sphenoid sinus. Mild bilateral ethmoid sinus mucosal thickening. 13 mm focus of polypoid soft tissue within the left nasal passage  (series 16, image 5). Soft tissues: The visualized maxillofacial soft tissues are unremarkable. IMPRESSION: MRI brain: Unremarkable MRI appearance of the brain. No evidence of acute intracranial abnormality. MRI orbits: 1. Swelling, T2 hyperintensity and abnormal enhancement of the intraorbital right optic nerve compatible with acute right optic neuritis. 2. Ethmoid and left sphenoid sinusitis. 3. Polypoid soft tissue measuring 13 mm within the left nasal passage. Direct visualization is recommended. Electronically Signed   By: Jackey Loge DO   On: 06/17/2020 14:54        The results of significant diagnostics from this hospitalization (including imaging, microbiology, ancillary and laboratory) are listed below for reference.     Microbiology: Recent Results (from the past 240 hour(s))  Resp Panel by RT-PCR (Flu A&B, Covid) Nasopharyngeal Swab     Status: None   Collection Time: 06/17/20  4:22 PM   Specimen: Nasopharyngeal Swab; Nasopharyngeal(NP) swabs in vial transport medium  Result Value Ref Range Status   SARS Coronavirus 2 by RT PCR NEGATIVE NEGATIVE Final    Comment: (NOTE) SARS-CoV-2 target nucleic acids are NOT DETECTED.  The SARS-CoV-2 RNA is generally detectable in upper respiratory specimens during the acute phase of infection. The lowest concentration of SARS-CoV-2 viral copies this assay can detect is 138 copies/mL. A negative result does not preclude SARS-Cov-2 infection and should not be used as the sole basis for treatment or other patient management decisions. A negative result may occur with  improper specimen collection/handling, submission of specimen other than nasopharyngeal swab, presence of viral mutation(s) within the areas targeted by this assay, and inadequate number of viral copies(<138 copies/mL). A negative result must be combined with clinical observations, patient history, and epidemiological information. The expected result is Negative.  Fact Sheet  for Patients:  BloggerCourse.com  Fact Sheet for Healthcare Providers:  SeriousBroker.it  This test is no t yet approved or cleared by the [place] FDA and  has been authorized for detection and/or diagnosis of SARS-CoV-2 by FDA under an Emergency Use Authorization (EUA). This EUA will remain  in effect (meaning this test can be used) for the duration of the COVID-19 declaration under Section 564(b)(1) of the Act, 21 U.S.C.section 360bbb-3(b)(1), unless the authorization is terminated  or revoked sooner.       Influenza A by PCR NEGATIVE NEGATIVE Final   Influenza B by PCR NEGATIVE NEGATIVE Final    Comment: (NOTE) The Xpert Xpress SARS-CoV-2/FLU/RSV plus assay is intended as an aid in the diagnosis of influenza from Nasopharyngeal swab specimens and should not be used as a sole basis for treatment. Nasal washings and aspirates are unacceptable for Xpert Xpress SARS-CoV-2/FLU/RSV testing.  Fact Sheet for Patients: BloggerCourse.com  Fact Sheet for Healthcare Providers: SeriousBroker.it  This test is not yet approved or cleared by the [place] FDA and has been authorized for detection and/or diagnosis of SARS-CoV-2 by FDA under an Emergency Use Authorization (EUA). This EUA will remain in effect (meaning this test can be used) for the duration of the COVID-19 declaration under Section 564(b)(1) of the Act, 21 U.S.C. section 360bbb-3(b)(1), unless the authorization is terminated or revoked.  Performed at [facility], [address] [place], [place] [address]      Labs: BNP (last 3 results) No results for input(s): BNP in the last 8760 hours. Basic Metabolic  Panel: Recent Labs  Lab 06/17/20 1120 06/18/20 0415  NA 138 136  K 4.4 4.3  CL 106 106  CO2 25 21*  GLUCOSE 87 119*  BUN 7 10  CREATININE 0.59 0.61  CALCIUM 9.1 9.0   Liver  Function Tests: Recent Labs  Lab 06/18/20 0415  AST 17  ALT 12  ALKPHOS 55  BILITOT 0.6  PROT 7.2  ALBUMIN 4.3   No results for input(s): LIPASE, AMYLASE in the last 168 hours. No results for input(s): AMMONIA in the last 168 hours. CBC: Recent Labs  Lab 06/17/20 1120 06/18/20 0415  WBC 4.3 5.3  NEUTROABS 2.2  --   HGB 13.9 14.1  HCT 42.7 42.9  MCV 89.9 89.7  PLT 355 367   Cardiac Enzymes: No results for input(s): CKTOTAL, CKMB, CKMBINDEX, TROPONINI in the last 168 hours. BNP: Invalid input(s): POCBNP CBG: No results for input(s): GLUCAP in the last 168 hours. D-Dimer No results for input(s): DDIMER in the last 72 hours. Hgb A1c No results for input(s): HGBA1C in the last 72 hours. Lipid Profile No results for input(s): CHOL, HDL, LDLCALC, TRIG, CHOLHDL, LDLDIRECT in the last 72 hours. Thyroid function studies No results for input(s): TSH, T4TOTAL, T3FREE, THYROIDAB in the last 72 hours.  Invalid input(s): FREET3 Anemia work up No results for input(s): VITAMINB12, FOLATE, FERRITIN, TIBC, IRON, RETICCTPCT in the last 72 hours. Urinalysis No results found for: COLORURINE, APPEARANCEUR, LABSPEC, PHURINE, GLUCOSEU, HGBUR, BILIRUBINUR, KETONESUR, PROTEINUR, UROBILINOGEN, NITRITE, LEUKOCYTESUR Sepsis Labs Invalid input(s): PROCALCITONIN,  WBC,  LACTICIDVEN   Time coordinating discharge: 25 minutes  SIGNED:  Almon Hercules, MD  [facility] 06/21/2020, 1:43 PM  If 7PM-7AM, please contact night-coverage [email or web address]

## 2020-06-21 NOTE — Plan of Care (Signed)

## 2020-06-27 ENCOUNTER — Encounter: Payer: Self-pay | Admitting: Neurology

## 2020-06-28 ENCOUNTER — Ambulatory Visit (INDEPENDENT_AMBULATORY_CARE_PROVIDER_SITE_OTHER): Payer: BC Managed Care – PPO | Admitting: Family Medicine

## 2020-06-28 ENCOUNTER — Encounter: Payer: Self-pay | Admitting: Family Medicine

## 2020-06-28 ENCOUNTER — Other Ambulatory Visit: Payer: Self-pay

## 2020-06-28 VITALS — BP 111/75 | Temp 98.7°F | Ht 67.0 in | Wt 204.8 lb

## 2020-06-28 DIAGNOSIS — Z113 Encounter for screening for infections with a predominantly sexual mode of transmission: Secondary | ICD-10-CM

## 2020-06-28 DIAGNOSIS — R200 Anesthesia of skin: Secondary | ICD-10-CM

## 2020-06-28 DIAGNOSIS — Z09 Encounter for follow-up examination after completed treatment for conditions other than malignant neoplasm: Secondary | ICD-10-CM

## 2020-06-28 DIAGNOSIS — H469 Unspecified optic neuritis: Secondary | ICD-10-CM | POA: Diagnosis not present

## 2020-06-28 DIAGNOSIS — R202 Paresthesia of skin: Secondary | ICD-10-CM

## 2020-06-28 DIAGNOSIS — Z7689 Persons encountering health services in other specified circumstances: Secondary | ICD-10-CM | POA: Diagnosis not present

## 2020-06-28 LAB — POCT URINALYSIS DIPSTICK
Glucose, UA: NEGATIVE
Leukocytes, UA: NEGATIVE
Nitrite, UA: NEGATIVE
Protein, UA: POSITIVE — AB
Spec Grav, UA: >=1.03 — AB (ref 1.010–1.025)
Urobilinogen, UA: 1 E.U./dL
pH, UA: 6.5 (ref 5.0–8.0)

## 2020-06-28 LAB — POCT GLYCOSYLATED HEMOGLOBIN (HGB A1C)
HbA1c POC (<> result, manual entry): 5.4 % (ref 4.0–5.6)
HbA1c, POC (controlled diabetic range): 5.4 % (ref 0.0–7.0)
HbA1c, POC (prediabetic range): 5.4 % — AB (ref 5.7–6.4)
Hemoglobin A1C: 5.4 % (ref 4.0–5.6)

## 2020-06-28 LAB — GLUCOSE, POCT (MANUAL RESULT ENTRY): POC Glucose: 58 mg/dl — AB (ref 70–99)
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
## 2020-06-30 LAB — CHLAMYDIA/GONOCOCCUS/TRICHOMONAS, NAA
Chlamydia by NAA: NEGATIVE
Gonococcus by NAA: NEGATIVE
Trich vag by NAA: NEGATIVE

## 2020-07-01 ENCOUNTER — Encounter: Payer: Self-pay | Admitting: Family Medicine
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
## 2020-07-04 ENCOUNTER — Encounter: Payer: Self-pay | Admitting: Neurology

## 2020-07-04 ENCOUNTER — Ambulatory Visit: Payer: BC Managed Care – PPO | Admitting: Neurology

## 2020-07-04 DIAGNOSIS — Z029 Encounter for administrative examinations, unspecified: Secondary | ICD-10-CM

## 2020-07-13 ENCOUNTER — Encounter: Payer: Self-pay | Admitting: Family Medicine

## 2020-07-14 ENCOUNTER — Telehealth: Payer: Self-pay | Admitting: Clinical

## 2020-07-14 NOTE — Telephone Encounter (Signed)
Called and spoke with pt she had a question regarding monthly period , she stated that it has been going on for about 2 weeks now , she that has lighted up  and like it going away. I told her if get heavy bleeding or started to have abd pain to contact the office. She said that she told that this was normal after getting fluids and starting birth control.

## 2020-07-14 NOTE — Telephone Encounter (Addendum)
[facility] Case Management Referral Note  07/14/2020 Name: Kayla Duarte MRN: [id number] DOB: January 29, 1996 Kayla Duarte is a 24 y.o. year old female who sees Kallie Locks, FNP for primary care. LCSW was consulted to assess patient's needs and assist the patient with [employer]  and Financial Difficulties related to lack of income.  Interpreter: No.   Interpreter Name & Language: none  Assessment: Patient experiencing Housing barriers. Patient is housed but does not have income right now as she is out of work for medical issues. She has applied for social security but is also awaiting neurology follow up. She has not been able to pay her rent this month and fears an eviction notice from her landlord.    Intervention: CSW referred patient to [employer] assistance program at social services office. Patient applied online for assistance while on the phone with CSW. Advised patient may also apply for rental assistance at [facility] if needed. Also advised patient of the [facility] for [employer] eviction mediation program that can assist in communicating and mediating with landlord to avoid eviction proceedings. CSW spoke to Dr. Ranell Patrick at the [facility] and also referred patient to speak to them directly at [phone].   Review of patient status, including review of consultants reports, relevant laboratory and other test results, and collaboration with appropriate care team members and the patient's provider was performed as part of comprehensive patient evaluation and provision of services.    SDOH (Social Determinants of Health) assessments performed: No   No outpatient encounter medications on file as of 07/14/2020.   No facility-administered encounter medications on file as of 07/14/2020.    Goals Addressed   None      Follow up Plan: 1. CSW available from clinic as needed  Abigail Butts,  LCSW [facility] [phone]

## 2020-07-27 ENCOUNTER — Ambulatory Visit: Payer: BC Managed Care – PPO | Admitting: Family Medicine

## 2020-07-27 ENCOUNTER — Telehealth: Payer: Self-pay | Admitting: Family Medicine

## 2020-07-27 NOTE — Telephone Encounter (Signed)
Pt was called to reschedule appointment she was a no show but no one answered the phone

## 2020-08-02 ENCOUNTER — Telehealth: Payer: Self-pay | Admitting: Family Medicine

## 2020-08-02 ENCOUNTER — Ambulatory Visit: Payer: BC Managed Care – PPO | Admitting: Family Medicine

## 2020-08-02 NOTE — Telephone Encounter (Signed)
Pt requesting a note from PCP stating that she can return to work 08/02/20. Please advise and thank you.

## 2020-08-05 ENCOUNTER — Telehealth: Payer: BC Managed Care – PPO | Admitting: Family Medicine

## 2020-08-09 ENCOUNTER — Ambulatory Visit: Payer: BC Managed Care – PPO | Admitting: Family Medicine

## 2020-08-17 ENCOUNTER — Other Ambulatory Visit: Payer: Self-pay | Admitting: Specialist

## 2020-08-17 DIAGNOSIS — H469 Unspecified optic neuritis: Secondary | ICD-10-CM

## 2020-08-26 ENCOUNTER — Encounter: Payer: BC Managed Care – PPO | Admitting: Family Medicine

## 2020-09-02 ENCOUNTER — Ambulatory Visit: Payer: BC Managed Care – PPO | Admitting: Neurology
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
## 2020-09-12 ENCOUNTER — Encounter: Payer: Self-pay | Admitting: Neurology

## 2020-09-12 ENCOUNTER — Ambulatory Visit: Payer: BC Managed Care – PPO | Admitting: Neurology

## 2020-09-12 DIAGNOSIS — Z029 Encounter for administrative examinations, unspecified: Secondary | ICD-10-CM

## 2020-10-12 ENCOUNTER — Encounter (HOSPITAL_COMMUNITY): Payer: Self-pay

## 2020-10-12 ENCOUNTER — Emergency Department (HOSPITAL_COMMUNITY)
Admission: EM | Admit: 2020-10-12 | Discharge: 2020-10-12 | Disposition: A | Discharge status: Home / Self Care | Payer: BC Managed Care – PPO | Attending: Emergency Medicine | Admitting: Emergency Medicine

## 2020-10-12 ENCOUNTER — Other Ambulatory Visit: Payer: Self-pay

## 2020-10-12 DIAGNOSIS — R202 Paresthesia of skin: Secondary | ICD-10-CM | POA: Insufficient documentation

## 2020-10-12 DIAGNOSIS — M791 Myalgia, unspecified site: Secondary | ICD-10-CM

## 2020-10-12 DIAGNOSIS — R5383 Other fatigue: Secondary | ICD-10-CM | POA: Insufficient documentation

## 2020-10-12 NOTE — Discharge Instructions (Signed)
Please follow-up with your neurologist.  Return to the ER for any new or worsening symptoms peer

## 2020-10-12 NOTE — ED Triage Notes (Signed)
Pt arrived via POV, states she has been in the process of a workup for MS. States she has had MRI but requesting repeat.
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
## 2020-10-28 ENCOUNTER — Other Ambulatory Visit: Payer: Self-pay | Admitting: Specialist

## 2020-10-28 DIAGNOSIS — H469 Unspecified optic neuritis: Secondary | ICD-10-CM

## 2020-11-10 ENCOUNTER — Other Ambulatory Visit: Payer: Self-pay

## 2020-11-10 ENCOUNTER — Ambulatory Visit
Admission: RE | Admit: 2020-11-10 | Discharge: 2020-11-10 | Disposition: A | Discharge status: Home / Self Care | Payer: BC Managed Care – PPO | Source: Ambulatory Visit | Attending: Specialist | Admitting: Specialist

## 2020-11-10 DIAGNOSIS — H469 Unspecified optic neuritis: Secondary | ICD-10-CM

## 2020-11-10 IMAGING — MR MR HEAD WO/W CM
11 series · 48 of 48 positions shown · IV contrast (multihance)
Comparison: MRIs of the brain and orbits [DATE]

CLINICAL DATA: Optic neuritis.  Evaluation for multiple sclerosis.

EXAM:
MRI HEAD WITHOUT AND WITH CONTRAST
TECHNIQUE: Multiplanar, multiecho pulse sequences of the brain and surrounding
structures were obtained without and with intravenous contrast.
CONTRAST:  20mL MULTIHANCE GADOBENATE DIMEGLUMINE 529 MG/ML IV SOLN

[Series 2: T1 · sagittal · 5.0mm · 0.45mm/px · 1 of 21 slices shown]
[im 1/21]
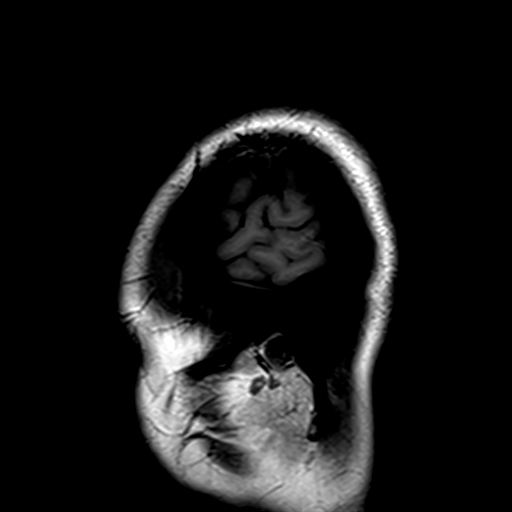

[Series 3: DWI · axial · 3.0mm · 1.80mm/px · z∈[-108,+34]mm · 8 of 100 slices shown (1 of 2)]
[im 1/100]
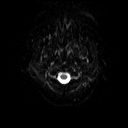
[im 15/100]
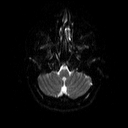
[im 29/100]
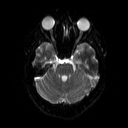
[im 43/100]
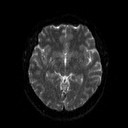
[im 57/100]
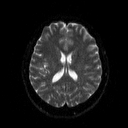
[im 71/100]
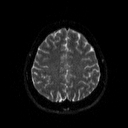
[im 85/100]
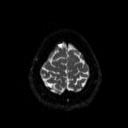
[im 100/100]
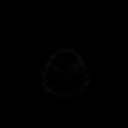

[Series 4: DWI · axial · 3.0mm · 1.80mm/px · z∈[-108,+34]mm · 4 of 50 slices shown (2 of 2)]
[im 1/50]
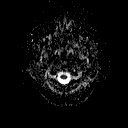
[im 17/50]
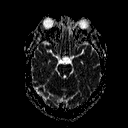
[im 33/50]
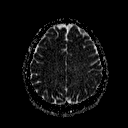
[im 50/50]
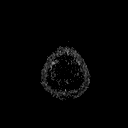

[Series 5: T2 · axial · 5.0mm · 0.72mm/px · z∈[-106,+32]mm · 2 of 22 slices shown (1 of 2)]
[im 1/22]
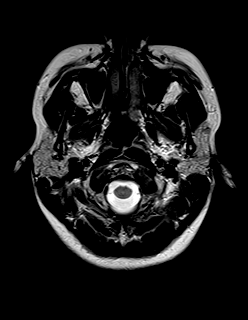
[im 22/22]
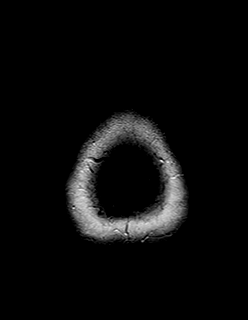

[Series 6: FLAIR · axial · 3.0mm · 0.45mm/px · z∈[-103,+28]mm · 2 of 30 slices shown (1 of 2)]
[im 1/30]
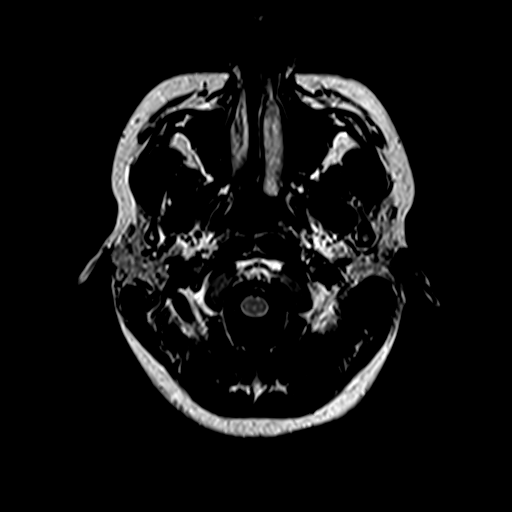
[im 30/30]
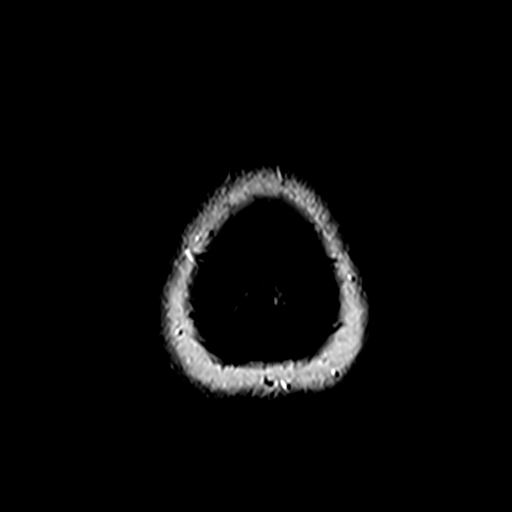

[Series 8: swi_images · axial · 4.0mm · 0.90mm/px · z∈[-105,+31]mm · 3 of 36 slices shown]
[im 1/36]
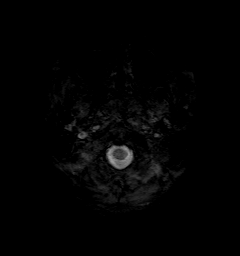
[im 18/36]
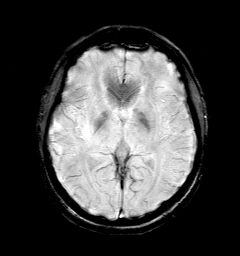
[im 36/36]
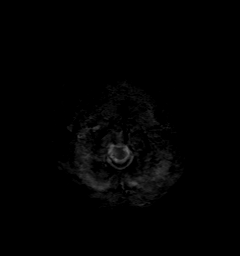

[Series 9: t1_mpr_tra · axial · 1.0mm · 0.75mm/px · z∈[-108,+31]mm · 11 of 144 slices shown]
[im 1/144]
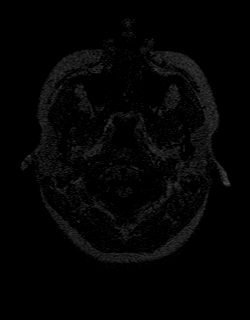
[im 15/144]
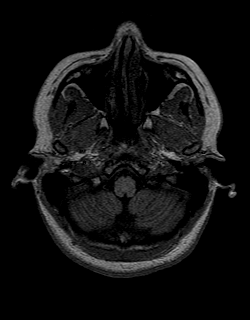
[im 29/144]
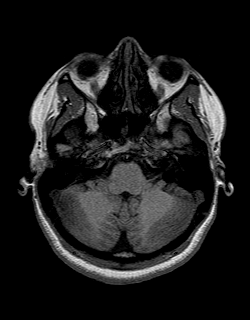
[im 43/144]
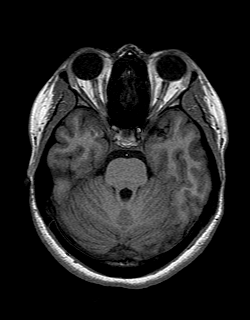
[im 58/144]
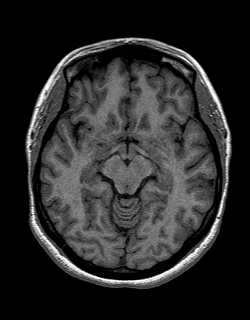
[im 72/144]
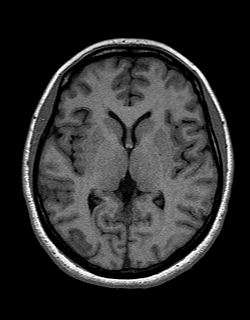
[im 86/144]
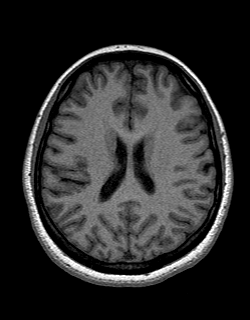
[im 101/144]
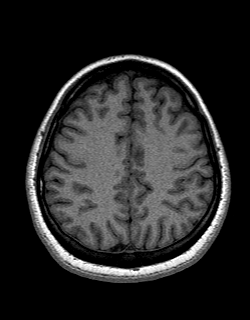
[im 115/144]
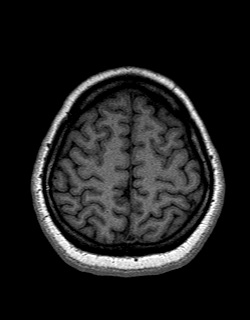
[im 129/144]
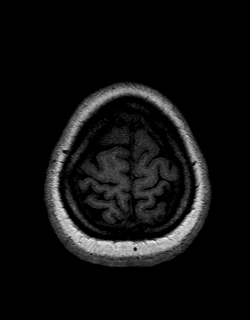
[im 144/144]
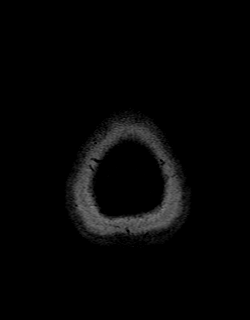

[Series 10: FLAIR · sagittal · 5.0mm · 0.45mm/px · 2 of 25 slices shown (2 of 2)]
[im 1/25]
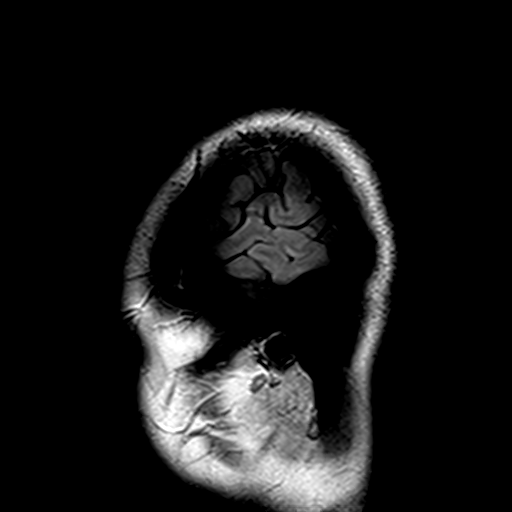
[im 25/25]
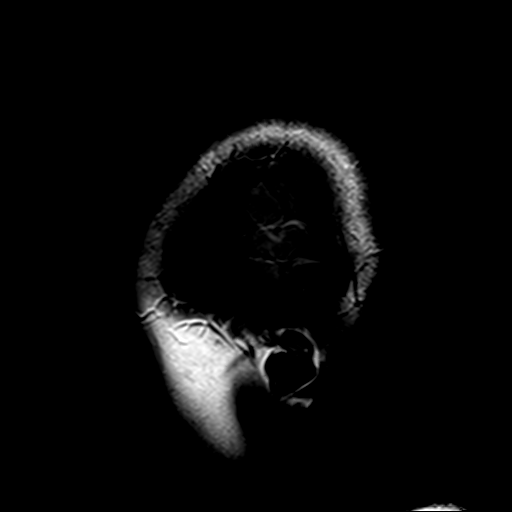

[Series 11: T2 · coronal · 5.0mm · 0.45mm/px · 2 of 25 slices shown (2 of 2)]
[im 1/25]
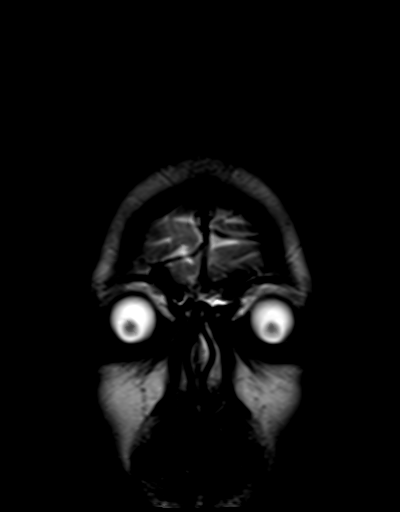
[im 25/25]
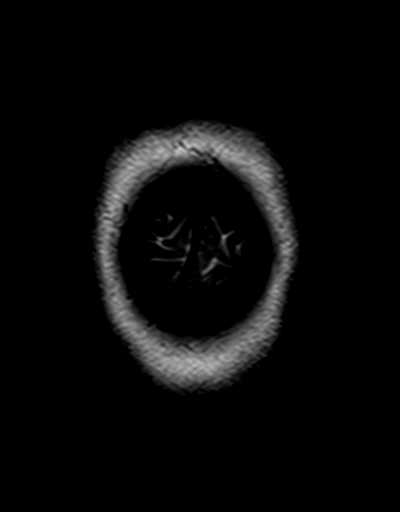

[Series 12: t1_mpr_tra post · axial · 1.0mm · 0.75mm/px · z∈[-108,+31]mm · 11 of 144 slices shown]
[im 1/144]
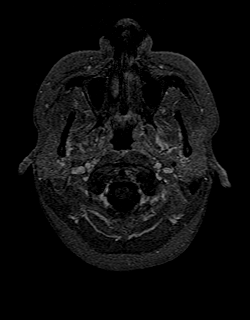
[im 15/144]
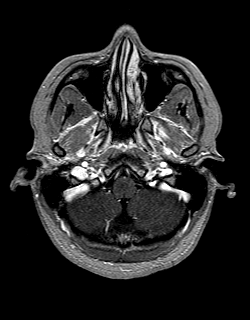
[im 29/144]
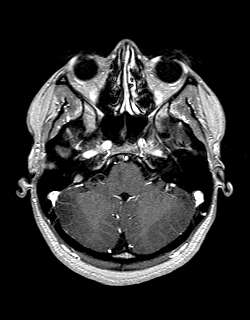
[im 43/144]
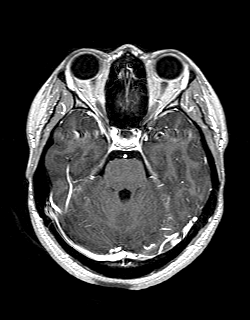
[im 58/144]
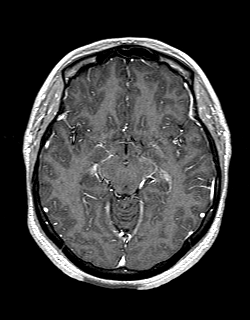
[im 72/144]
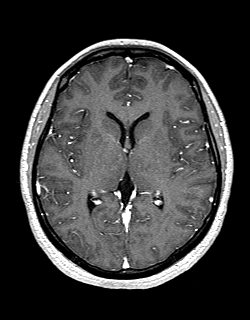
[im 86/144]
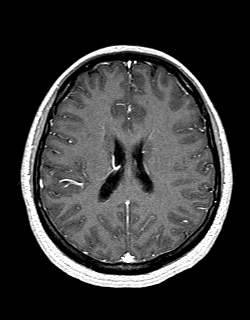
[im 101/144]
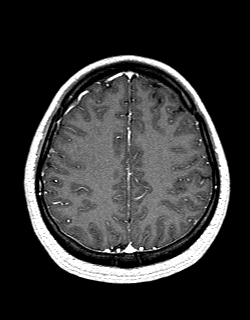
[im 115/144]
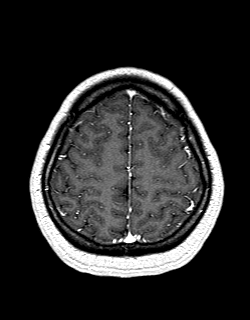
[im 129/144]
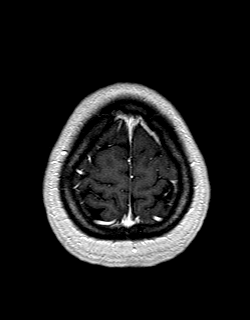
[im 144/144]
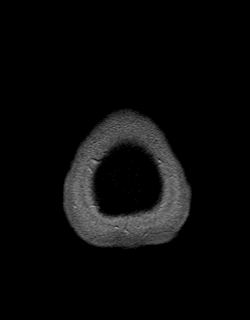

[Series 13: post cor · coronal · 5.0mm · 0.45mm/px · 2 of 25 slices shown]
[im 1/25]
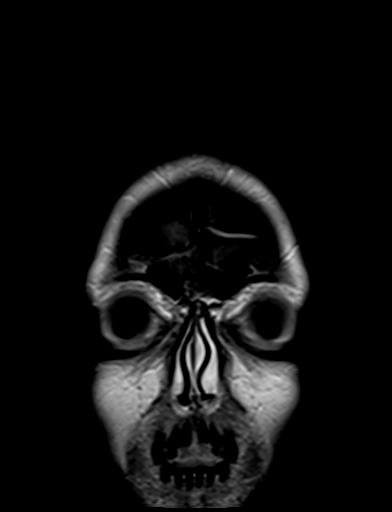
[im 25/25]
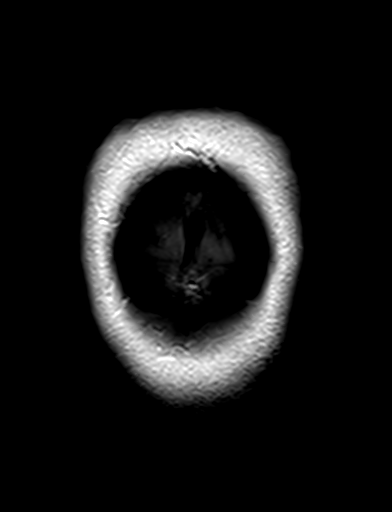

[48 of 48 positions shown; findings below may reference images not displayed]

FINDINGS: Brain: There is no evidence of an acute infarct, intracranial
hemorrhage, mass, midline shift, or extra-axial fluid collection.
The ventricles and sulci are normal. There is a 5 mm focus of T2
FLAIR hyperintensity in the white matter of the posteromedial left
temporal lobe, either new or more conspicuous than on the prior
study which was motion degraded. The brain is normal in signal
elsewhere. No abnormal enhancement is identified.

Vascular: Major intracranial vascular flow voids are preserved.

Skull and upper cervical spine: Unremarkable bone marrow signal.

Sinuses/Orbits: Grossly unremarkable appearance of the orbits on
this nondedicated study. Mild scattered mucosal thickening in the
paranasal sinuses. Clear mastoid air cells.

Other: None.
IMPRESSION: 1. Small focus of T2 hyperintensity in the posterior left temporal
white matter, nonspecific though could reflect a demyelinating
plaque given the history of optic neuritis. No abnormal enhancement
to suggest active demyelination.
2. Otherwise unremarkable appearance of the brain.

## 2020-11-10 MED ORDER — GADOBENATE DIMEGLUMINE 529 MG/ML IV SOLN
20.0000 mL | Freq: Once | INTRAVENOUS | Status: AC | PRN
  Administered 2020-11-10: 20 mL via INTRAVENOUS

## 2020-12-16 ENCOUNTER — Ambulatory Visit (INDEPENDENT_AMBULATORY_CARE_PROVIDER_SITE_OTHER): Payer: BC Managed Care – PPO | Admitting: Nurse Practitioner

## 2020-12-16 ENCOUNTER — Encounter: Payer: Self-pay | Admitting: Nurse Practitioner

## 2020-12-16 ENCOUNTER — Other Ambulatory Visit: Payer: Self-pay

## 2020-12-16 VITALS — BP 112/74 | HR 80 | Temp 98.4°F | Ht 67.0 in | Wt 182.0 lb

## 2020-12-16 DIAGNOSIS — Z0000 Encounter for general adult medical examination without abnormal findings: Secondary | ICD-10-CM | POA: Diagnosis not present

## 2020-12-16 DIAGNOSIS — R5383 Other fatigue: Secondary | ICD-10-CM | POA: Diagnosis not present

## 2020-12-16 DIAGNOSIS — F419 Anxiety disorder, unspecified: Secondary | ICD-10-CM

## 2020-12-16 DIAGNOSIS — R200 Anesthesia of skin: Secondary | ICD-10-CM | POA: Diagnosis not present

## 2020-12-16 DIAGNOSIS — R202 Paresthesia of skin: Secondary | ICD-10-CM

## 2020-12-16 DIAGNOSIS — R110 Nausea: Secondary | ICD-10-CM | POA: Diagnosis not present

## 2020-12-16 DIAGNOSIS — Z1321 Encounter for screening for nutritional disorder: Secondary | ICD-10-CM | POA: Diagnosis not present

## 2020-12-16 DIAGNOSIS — Z130 Encounter for screening for diseases of the blood and blood-forming organs and certain disorders involving the immune mechanism: Secondary | ICD-10-CM

## 2020-12-16 DIAGNOSIS — N898 Other specified noninflammatory disorders of vagina: Secondary | ICD-10-CM

## 2020-12-16 LAB — POCT URINALYSIS DIPSTICK
Blood, UA: NEGATIVE
Glucose, UA: NEGATIVE
Ketones, UA: NEGATIVE
Leukocytes, UA: NEGATIVE
Nitrite, UA: NEGATIVE
Protein, UA: NEGATIVE
Spec Grav, UA: >=1.03 — AB
Urobilinogen, UA: 1 U/dL
pH, UA: 6

## 2020-12-16 MED ORDER — CITALOPRAM HYDROBROMIDE 10 MG PO TABS: 10.0000 mg | ORAL_TABLET | Freq: Every day | ORAL | 11 refills | Status: DC
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
## 2020-12-16 NOTE — Patient Instructions (Signed)
 Health Maintenance, Female Adopting a healthy lifestyle and getting preventive care are important in promoting health and wellness. Ask your health care provider about:  The right schedule for you to have regular tests and exams.  Things you can do on your own to prevent diseases and keep yourself healthy. What should I know about diet, weight, and exercise? Eat a healthy diet  Eat a diet that includes plenty of vegetables, fruits, low-fat dairy products, and lean protein.  Do not eat a lot of foods that are high in solid fats, added sugars, or sodium.   Maintain a healthy weight Body mass index (BMI) is used to identify weight problems. It estimates body fat based on height and weight. Your health care provider can help determine your BMI and help you achieve or maintain a healthy weight. Get regular exercise Get regular exercise. This is one of the most important things you can do for your health. Most adults should:  Exercise for at least 150 minutes each week. The exercise should increase your heart rate and make you sweat (moderate-intensity exercise).  Do strengthening exercises at least twice a week. This is in addition to the moderate-intensity exercise.  Spend less time sitting. Even light physical activity can be beneficial. Watch cholesterol and blood lipids Have your blood tested for lipids and cholesterol at 23 years of age, then have this test every 5 years. Have your cholesterol levels checked more often if:  Your lipid or cholesterol levels are high.  You are older than 23 years of age.  You are at high risk for heart disease. What should I know about cancer screening? Depending on your health history and family history, you may need to have cancer screening at various ages. This may include screening for:  Breast cancer.  Cervical cancer.  Colorectal cancer.  Skin cancer.  Lung cancer. What should I know about heart disease, diabetes, and high blood  pressure? Blood pressure and heart disease  High blood pressure causes heart disease and increases the risk of stroke. This is more likely to develop in people who have high blood pressure readings, are of African descent, or are overweight.  Have your blood pressure checked: ? Every 3-5 years if you are 18-39 years of age. ? Every year if you are 40 years old or older. Diabetes Have regular diabetes screenings. This checks your fasting blood sugar level. Have the screening done:  Once every three years after age 40 if you are at a normal weight and have a low risk for diabetes.  More often and at a younger age if you are overweight or have a high risk for diabetes. What should I know about preventing infection? Hepatitis B If you have a higher risk for hepatitis B, you should be screened for this virus. Talk with your health care provider to find out if you are at risk for hepatitis B infection. Hepatitis C Testing is recommended for:  Everyone born from 1945 through 1965.  Anyone with known risk factors for hepatitis C. Sexually transmitted infections (STIs)  Get screened for STIs, including gonorrhea and chlamydia, if: ? You are sexually active and are younger than 24 years of age. ? You are older than 24 years of age and your health care provider tells you that you are at risk for this type of infection. ? Your sexual activity has changed since you were last screened, and you are at increased risk for chlamydia or gonorrhea. Ask your health care   provider if you are at risk.  Ask your health care provider about whether you are at high risk for HIV. Your health care provider may recommend a prescription medicine to help prevent HIV infection. If you choose to take medicine to prevent HIV, you should first get tested for HIV. You should then be tested every 3 months for as long as you are taking the medicine. Pregnancy  If you are about to stop having your period (premenopausal) and  you may become pregnant, seek counseling before you get pregnant.  Take 400 to 800 micrograms (mcg) of folic acid every day if you become pregnant.  Ask for birth control (contraception) if you want to prevent pregnancy. Osteoporosis and menopause Osteoporosis is a disease in which the bones lose minerals and strength with aging. This can result in bone fractures. If you are 65 years old or older, or if you are at risk for osteoporosis and fractures, ask your health care provider if you should:  Be screened for bone loss.  Take a calcium or vitamin D supplement to lower your risk of fractures.  Be given hormone replacement therapy (HRT) to treat symptoms of menopause. Follow these instructions at home: Lifestyle  Do not use any products that contain nicotine or tobacco, such as cigarettes, e-cigarettes, and chewing tobacco. If you need help quitting, ask your health care provider.  Do not use street drugs.  Do not share needles.  Ask your health care provider for help if you need support or information about quitting drugs. Alcohol use  Do not drink alcohol if: ? Your health care provider tells you not to drink. ? You are pregnant, may be pregnant, or are planning to become pregnant.  If you drink alcohol: ? Limit how much you use to 0-1 drink a day. ? Limit intake if you are breastfeeding.  Be aware of how much alcohol is in your drink. In the [place] one drink equals one 12 oz bottle of beer (355 mL), one 5 oz glass of wine (148 mL), or one 1 oz glass of hard liquor (44 mL). General instructions  Schedule regular health, dental, and eye exams.  Stay current with your vaccines.  Tell your health care provider if: ? You often feel depressed. ? You have ever been abused or do not feel safe at home. Summary  Adopting a healthy lifestyle and getting preventive care are important in promoting health and wellness.  Follow your health care provider's instructions about healthy  diet, exercising, and getting tested or screened for diseases.  Follow your health care provider's instructions on monitoring your cholesterol and blood pressure. This information is not intended to replace advice given to you by your health care provider. Make sure you discuss any questions you have with your health care provider. Document Revised: 07/09/2018 Document Reviewed: 07/09/2018 Elsevier Patient Education  2021 Elsevier Inc. Managing Anxiety, Adult After being diagnosed with an anxiety disorder, you may be relieved to know why you have felt or behaved a certain way. You may also feel overwhelmed about the treatment ahead and what it will mean for your life. With care and support, you can manage this condition and recover from it. How to manage lifestyle changes Managing stress and anxiety Stress is your body's reaction to life changes and events, both good and bad. Most stress will last just a few hours, but stress can be ongoing and can lead to more than just stress. Although stress can play a major role in   anxiety, it is not the same as anxiety. Stress is usually caused by something external, such as a deadline, test, or competition. Stress normally passes after the triggering event has ended.  Anxiety is caused by something internal, such as imagining a terrible outcome or worrying that something will go wrong that will devastate you. Anxiety often does not go away even after the triggering event is over, and it can become long-term (chronic) worry. It is important to understand the differences between stress and anxiety and to manage your stress effectively so that it does not lead to an anxious response. Talk with your health care provider or a counselor to learn more about reducing anxiety and stress. He or she may suggest tension reduction techniques, such as:  Music therapy. This can include creating or listening to music that you enjoy and that inspires you.  Mindfulness-based  meditation. This involves being aware of your normal breaths while not trying to control your breathing. It can be done while sitting or walking.  Centering prayer. This involves focusing on a word, phrase, or sacred image that means something to you and brings you peace.  Deep breathing. To do this, expand your stomach and inhale slowly through your nose. Hold your breath for 3-5 seconds. Then exhale slowly, letting your stomach muscles relax.  Self-talk. This involves identifying thought patterns that lead to anxiety reactions and changing those patterns.  Muscle relaxation. This involves tensing muscles and then relaxing them. Choose a tension reduction technique that suits your lifestyle and personality. These techniques take time and practice. Set aside 5-15 minutes a day to do them. Therapists can offer counseling and training in these techniques. The training to help with anxiety may be covered by some insurance plans. Other things you can do to manage stress and anxiety include:  Keeping a stress/anxiety diary. This can help you learn what triggers your reaction and then learn ways to manage your response.  Thinking about how you react to certain situations. You may not be able to control everything, but you can control your response.  Making time for activities that help you relax and not feeling guilty about spending your time in this way.  Visual imagery and yoga can help you stay calm and relax.   Medicines Medicines can help ease symptoms. Medicines for anxiety include:  Anti-anxiety drugs.  Antidepressants. Medicines are often used as a primary treatment for anxiety disorder. Medicines will be prescribed by a health care provider. When used together, medicines, psychotherapy, and tension reduction techniques may be the most effective treatment. Relationships Relationships can play a big part in helping you recover. Try to spend more time connecting with trusted friends and  family members. Consider going to couples counseling, taking family education classes, or going to family therapy. Therapy can help you and others better understand your condition. How to recognize changes in your anxiety Everyone responds differently to treatment for anxiety. Recovery from anxiety happens when symptoms decrease and stop interfering with your daily activities at home or work. This may mean that you will start to:  Have better concentration and focus. Worry will interfere less in your daily thinking.  Sleep better.  Be less irritable.  Have more energy.  Have improved memory. It is important to recognize when your condition is getting worse. Contact your health care provider if your symptoms interfere with home or work and you feel like your condition is not improving. Follow these instructions at home: Activity  Exercise. Most adults   should do the following: ? Exercise for at least 150 minutes each week. The exercise should increase your heart rate and make you sweat (moderate-intensity exercise). ? Strengthening exercises at least twice a week.  Get the right amount and quality of sleep. Most adults need 7-9 hours of sleep each night. Lifestyle  Eat a healthy diet that includes plenty of vegetables, fruits, whole grains, low-fat dairy products, and lean protein. Do not eat a lot of foods that are high in solid fats, added sugars, or salt.  Make choices that simplify your life.  Do not use any products that contain nicotine or tobacco, such as cigarettes, e-cigarettes, and chewing tobacco. If you need help quitting, ask your health care provider.  Avoid caffeine, alcohol, and certain over-the-counter cold medicines. These may make you feel worse. Ask your pharmacist which medicines to avoid.   General instructions  Take over-the-counter and prescription medicines only as told by your health care provider.  Keep all follow-up visits as told by your health care  provider. This is important. Where to find support You can get help and support from these sources:  Self-help groups.  Online and community organizations.  A trusted spiritual leader.  Couples counseling.  Family education classes.  Family therapy. Where to find more information You may find that joining a support group helps you deal with your anxiety. The following sources can help you locate counselors or support groups near you:  Mental Health America: www.mentalhealthamerica.net  Anxiety and Depression Association of America (ADAA): www.adaa.org  National Alliance on Mental Illness (NAMI): www.nami.org Contact a health care provider if you:  Have a hard time staying focused or finishing daily tasks.  Spend many hours a day feeling worried about everyday life.  Become exhausted by worry.  Start to have headaches, feel tense, or have nausea.  Urinate more than normal.  Have diarrhea. Get help right away if you have:  A racing heart and shortness of breath.  Thoughts of hurting yourself or others. If you ever feel like you may hurt yourself or others, or have thoughts about taking your own life, get help right away. You can go to your nearest emergency department or call:  Your local emergency services (911 in the [place]  A suicide crisis helpline, such as the National Suicide Prevention Lifeline at 1-800-273-8255. This is open 24 hours a day. Summary  Taking steps to learn and use tension reduction techniques can help calm you and help prevent triggering an anxiety reaction.  When used together, medicines, psychotherapy, and tension reduction techniques may be the most effective treatment.  Family, friends, and partners can play a big part in helping you recover from an anxiety disorder. This information is not intended to replace advice given to you by your health care provider. Make sure you discuss any questions you have with your health care  provider. Document Revised: 12/16/2018 Document Reviewed: 12/16/2018 Elsevier Patient Education  2021 Elsevier Inc.    

## 2020-12-17 LAB — CBC WITH DIFFERENTIAL/PLATELET
Basophils Absolute: 0.1 10*3/uL (ref 0.0–0.2)
Basos: 2 %
EOS (ABSOLUTE): 0.1 10*3/uL (ref 0.0–0.4)
Eos: 2 %
Hematocrit: 42.6 % (ref 34.0–46.6)
Hemoglobin: 14 g/dL (ref 11.1–15.9)
Immature Grans (Abs): 0 10*3/uL (ref 0.0–0.1)
Immature Granulocytes: 0 %
Lymphocytes Absolute: 1.4 10*3/uL (ref 0.7–3.1)
Lymphs: 34 %
MCH: 28.4 pg (ref 26.6–33.0)
MCHC: 32.9 g/dL (ref 31.5–35.7)
MCV: 86 fL (ref 79–97)
Monocytes Absolute: 0.3 10*3/uL (ref 0.1–0.9)
Monocytes: 8 %
Neutrophils Absolute: 2.2 10*3/uL (ref 1.4–7.0)
Neutrophils: 54 %
Platelets: 430 10*3/uL (ref 150–450)
RBC: 4.93 x10E6/uL (ref 3.77–5.28)
RDW: 14.5 % (ref 11.7–15.4)
WBC: 4.1 10*3/uL (ref 3.4–10.8)

## 2020-12-17 LAB — IRON,TIBC AND FERRITIN PANEL
Ferritin: 17 ng/mL (ref 15–150)
Iron Saturation: 24 % (ref 15–55)
Iron: 100 ug/dL (ref 27–159)
Total Iron Binding Capacity: 411 ug/dL (ref 250–450)
UIBC: 311 ug/dL (ref 131–425)

## 2020-12-17 LAB — COMP. METABOLIC PANEL (12)
AST: 13 IU/L (ref 0–40)
Albumin/Globulin Ratio: 1.7 (ref 1.2–2.2)
Albumin: 4.8 g/dL (ref 3.9–5.0)
Alkaline Phosphatase: 63 IU/L (ref 44–121)
BUN/Creatinine Ratio: 10 (ref 9–23)
BUN: 8 mg/dL (ref 6–20)
Bilirubin Total: 0.8 mg/dL (ref 0.0–1.2)
Calcium: 10.1 mg/dL (ref 8.7–10.2)
Chloride: 102 mmol/L (ref 96–106)
Creatinine, Ser: 0.78 mg/dL (ref 0.57–1.00)
Globulin, Total: 2.8 g/dL (ref 1.5–4.5)
Glucose: 88 mg/dL (ref 65–99)
Potassium: 4.4 mmol/L (ref 3.5–5.2)
Sodium: 140 mmol/L (ref 134–144)
Total Protein: 7.6 g/dL (ref 6.0–8.5)
eGFR: 110 mL/min/{1.73_m2} (ref >59)

## 2020-12-17 LAB — VITAMIN D 25 HYDROXY (VIT D DEFICIENCY, FRACTURES): Vit D, 25-Hydroxy: 8.5 ng/mL — ABNORMAL LOW (ref 30.0–100.0)

## 2020-12-17 LAB — TSH: TSH: 1.11 u[IU]/mL (ref 0.450–4.500)

## 2020-12-17 LAB — VITAMIN B12: Vitamin B-12: 570 pg/mL (ref 232–1245)

## 2020-12-20 ENCOUNTER — Other Ambulatory Visit: Payer: Self-pay | Admitting: Nurse Practitioner

## 2020-12-20 LAB — NUSWAB VAGINITIS (VG)
Atopobium vaginae: HIGH Score — AB
BVAB 2: HIGH Score — AB
Candida albicans, NAA: NEGATIVE
Candida glabrata, NAA: NEGATIVE
Trich vag by NAA: NEGATIVE

## 2020-12-20 MED ORDER — METRONIDAZOLE 500 MG PO TABS: 500.0000 mg | ORAL_TABLET | Freq: Three times a day (TID) | ORAL | 0 refills | Status: AC

## 2020-12-20 NOTE — Progress Notes (Signed)
   [facility] [address] , [place]  [address] Phone:  [phone]   Fax:  [phone] 

## 2021-02-10 ENCOUNTER — Other Ambulatory Visit: Payer: Self-pay

## 2021-02-10 ENCOUNTER — Ambulatory Visit (INDEPENDENT_AMBULATORY_CARE_PROVIDER_SITE_OTHER): Payer: BC Managed Care – PPO | Admitting: Nurse Practitioner

## 2021-02-10 ENCOUNTER — Encounter: Payer: Self-pay | Admitting: Nurse Practitioner

## 2021-02-10 VITALS — BP 101/64 | HR 87 | Temp 97.6°F | Ht 67.0 in | Wt 179.0 lb

## 2021-02-10 DIAGNOSIS — H469 Unspecified optic neuritis: Secondary | ICD-10-CM | POA: Diagnosis not present

## 2021-02-10 DIAGNOSIS — F419 Anxiety disorder, unspecified: Secondary | ICD-10-CM | POA: Diagnosis not present

## 2021-02-10 DIAGNOSIS — N760 Acute vaginitis: Secondary | ICD-10-CM

## 2021-02-10 DIAGNOSIS — R0602 Shortness of breath: Secondary | ICD-10-CM

## 2021-02-10 DIAGNOSIS — B9689 Other specified bacterial agents as the cause of diseases classified elsewhere: Secondary | ICD-10-CM

## 2021-02-10 MED ORDER — METRONIDAZOLE 500 MG PO TABS: 500.0000 mg | ORAL_TABLET | Freq: Four times a day (QID) | ORAL | 0 refills | Status: AC

## 2021-02-10 MED ORDER — ALBUTEROL SULFATE HFA 108 (90 BASE) MCG/ACT IN AERS: 2.0000 | INHALATION_SPRAY | Freq: Four times a day (QID) | RESPIRATORY_TRACT | 2 refills | Status: AC | PRN

## 2021-02-10 MED ORDER — CITALOPRAM HYDROBROMIDE 10 MG PO TABS: 10.0000 mg | ORAL_TABLET | Freq: Every day | ORAL | 3 refills | Status: AC
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
## 2021-02-10 NOTE — Patient Instructions (Signed)

## 2021-05-15 ENCOUNTER — Ambulatory Visit: Payer: BC Managed Care – PPO | Admitting: Nurse Practitioner

## 2022-01-10 ENCOUNTER — Other Ambulatory Visit: Payer: Self-pay | Admitting: Specialist

## 2022-01-10 DIAGNOSIS — G35 Multiple sclerosis: Secondary | ICD-10-CM

## 2022-02-08 ENCOUNTER — Ambulatory Visit
Admission: RE | Admit: 2022-02-08 | Discharge: 2022-02-08 | Disposition: A | Discharge status: Home / Self Care | Payer: BC Managed Care – PPO | Source: Ambulatory Visit | Attending: Specialist | Admitting: Specialist

## 2022-02-08 DIAGNOSIS — G35 Multiple sclerosis: Secondary | ICD-10-CM
